# Patient Record
Sex: Male | Born: 1945 | Race: White | Hispanic: No | Marital: Single | Smoking: Never smoker
Health system: Southern US, Community
[De-identification: ages and names within clinical notes are randomized; demographics above are authoritative.]

## PROBLEM LIST (undated history)

## (undated) DIAGNOSIS — D126 Benign neoplasm of colon, unspecified: Secondary | ICD-10-CM

## (undated) DIAGNOSIS — K8020 Calculus of gallbladder without cholecystitis without obstruction: Secondary | ICD-10-CM

## (undated) DIAGNOSIS — K4020 Bilateral inguinal hernia, without obstruction or gangrene, not specified as recurrent: Secondary | ICD-10-CM

## (undated) DIAGNOSIS — K7689 Other specified diseases of liver: Secondary | ICD-10-CM

## (undated) DIAGNOSIS — I10 Essential (primary) hypertension: Secondary | ICD-10-CM

## (undated) DIAGNOSIS — K5730 Diverticulosis of large intestine without perforation or abscess without bleeding: Secondary | ICD-10-CM

## (undated) DIAGNOSIS — IMO0002 Reserved for concepts with insufficient information to code with codable children: Secondary | ICD-10-CM

## (undated) DIAGNOSIS — S22009A Unspecified fracture of unspecified thoracic vertebra, initial encounter for closed fracture: Secondary | ICD-10-CM

## (undated) DIAGNOSIS — N529 Male erectile dysfunction, unspecified: Secondary | ICD-10-CM

## (undated) DIAGNOSIS — I059 Rheumatic mitral valve disease, unspecified: Secondary | ICD-10-CM

## (undated) DIAGNOSIS — E7800 Pure hypercholesterolemia, unspecified: Secondary | ICD-10-CM

## (undated) DIAGNOSIS — S2249XA Multiple fractures of ribs, unspecified side, initial encounter for closed fracture: Secondary | ICD-10-CM

## (undated) DIAGNOSIS — R011 Cardiac murmur, unspecified: Secondary | ICD-10-CM

## (undated) DIAGNOSIS — M1990 Unspecified osteoarthritis, unspecified site: Secondary | ICD-10-CM

## (undated) DIAGNOSIS — E119 Type 2 diabetes mellitus without complications: Secondary | ICD-10-CM

## (undated) DIAGNOSIS — N200 Calculus of kidney: Secondary | ICD-10-CM

## (undated) HISTORY — DX: Unspecified fracture of unspecified thoracic vertebra, initial encounter for closed fracture: S22.009A

## (undated) HISTORY — DX: Multiple fractures of ribs, unspecified side, initial encounter for closed fracture: S22.49XA

## (undated) HISTORY — DX: Other specified diseases of liver: K76.89

## (undated) HISTORY — DX: Calculus of gallbladder without cholecystitis without obstruction: K80.20

## (undated) HISTORY — DX: Calculus of kidney: N20.0

## (undated) HISTORY — PX: BACK SURGERY: SHX140

## (undated) HISTORY — DX: Benign neoplasm of colon, unspecified: D12.6

## (undated) HISTORY — DX: Bilateral inguinal hernia, without obstruction or gangrene, not specified as recurrent: K40.20

## (undated) HISTORY — DX: Rheumatic mitral valve disease, unspecified: I05.9

## (undated) HISTORY — PX: SHOULDER ARTHROSCOPY: SHX128

## (undated) HISTORY — PX: TONSILLECTOMY: SUR1361

## (undated) HISTORY — PX: ROOT CANAL: SHX2363

## (undated) HISTORY — PX: ADENOIDECTOMY: SUR15

## (undated) HISTORY — DX: Reserved for concepts with insufficient information to code with codable children: IMO0002

## (undated) HISTORY — DX: Diverticulosis of large intestine without perforation or abscess without bleeding: K57.30

## (undated) HISTORY — DX: Pure hypercholesterolemia, unspecified: E78.00

## (undated) HISTORY — DX: Male erectile dysfunction, unspecified: N52.9

---

## 2004-06-17 ENCOUNTER — Ambulatory Visit: Payer: Self-pay | Admitting: Physician Assistant

## 2006-10-18 ENCOUNTER — Ambulatory Visit: Payer: Self-pay | Admitting: Gastroenterology

## 2009-04-01 ENCOUNTER — Ambulatory Visit: Payer: Self-pay | Admitting: Physician Assistant

## 2010-09-08 ENCOUNTER — Other Ambulatory Visit: Payer: Self-pay | Admitting: Chiropractic Medicine

## 2010-09-08 DIAGNOSIS — M545 Low back pain: Secondary | ICD-10-CM

## 2010-09-08 DIAGNOSIS — M999 Biomechanical lesion, unspecified: Secondary | ICD-10-CM

## 2010-09-08 DIAGNOSIS — M5430 Sciatica, unspecified side: Secondary | ICD-10-CM

## 2010-09-12 ENCOUNTER — Other Ambulatory Visit: Payer: Self-pay

## 2010-09-15 ENCOUNTER — Ambulatory Visit
Admission: RE | Admit: 2010-09-15 | Discharge: 2010-09-15 | Disposition: A | Discharge status: Home / Self Care | Payer: BC Managed Care – PPO | Source: Ambulatory Visit | Attending: Chiropractic Medicine | Admitting: Chiropractic Medicine

## 2010-09-15 DIAGNOSIS — M999 Biomechanical lesion, unspecified: Secondary | ICD-10-CM

## 2010-09-15 DIAGNOSIS — M545 Low back pain: Secondary | ICD-10-CM

## 2010-09-15 DIAGNOSIS — M5430 Sciatica, unspecified side: Secondary | ICD-10-CM

## 2010-10-16 ENCOUNTER — Encounter (HOSPITAL_COMMUNITY)
Admission: RE | Admit: 2010-10-16 | Discharge: 2010-10-16 | Disposition: A | Discharge status: Home / Self Care | Payer: BC Managed Care – PPO | Source: Ambulatory Visit | Attending: Neurosurgery | Admitting: Neurosurgery

## 2010-10-16 ENCOUNTER — Other Ambulatory Visit (HOSPITAL_COMMUNITY): Payer: Self-pay | Admitting: Neurosurgery

## 2010-10-16 ENCOUNTER — Ambulatory Visit (HOSPITAL_COMMUNITY)
Admission: RE | Admit: 2010-10-16 | Discharge: 2010-10-16 | Disposition: A | Discharge status: Home / Self Care | Payer: BC Managed Care – PPO | Source: Ambulatory Visit | Attending: Neurosurgery | Admitting: Neurosurgery

## 2010-10-16 DIAGNOSIS — M48061 Spinal stenosis, lumbar region without neurogenic claudication: Secondary | ICD-10-CM | POA: Insufficient documentation

## 2010-10-16 DIAGNOSIS — Z01812 Encounter for preprocedural laboratory examination: Secondary | ICD-10-CM | POA: Insufficient documentation

## 2010-10-16 DIAGNOSIS — Z01810 Encounter for preprocedural cardiovascular examination: Secondary | ICD-10-CM | POA: Insufficient documentation

## 2010-10-16 DIAGNOSIS — Z01811 Encounter for preprocedural respiratory examination: Secondary | ICD-10-CM | POA: Insufficient documentation

## 2010-10-16 LAB — CBC
MCH: 30.7 pg (ref 26.0–34.0)
MCHC: 36.1 g/dL — ABNORMAL HIGH (ref 30.0–36.0)
MCV: 85.2 fL (ref 78.0–100.0)
Platelets: 233 10*3/uL (ref 150–400)

## 2010-10-16 LAB — BASIC METABOLIC PANEL
Calcium: 10.7 mg/dL — ABNORMAL HIGH (ref 8.4–10.5)
Creatinine, Ser: 1.01 mg/dL (ref 0.50–1.35)
GFR calc non Af Amer: >60 mL/min (ref >60)
Glucose, Bld: 145 mg/dL — ABNORMAL HIGH (ref 70–99)
Sodium: 139 mEq/L (ref 135–145)

## 2010-10-16 LAB — SURGICAL PCR SCREEN
MRSA, PCR: NEGATIVE
Staphylococcus aureus: NEGATIVE

## 2010-10-22 ENCOUNTER — Ambulatory Visit (HOSPITAL_COMMUNITY): Payer: BC Managed Care – PPO

## 2010-10-22 ENCOUNTER — Ambulatory Visit (HOSPITAL_COMMUNITY)
Admission: RE | Admit: 2010-10-22 | Discharge: 2010-10-22 | Disposition: A | Discharge status: Home / Self Care | Payer: BC Managed Care – PPO | Source: Ambulatory Visit | Attending: Neurosurgery | Admitting: Neurosurgery

## 2010-10-22 DIAGNOSIS — Z01818 Encounter for other preprocedural examination: Secondary | ICD-10-CM | POA: Insufficient documentation

## 2010-10-22 DIAGNOSIS — Z01810 Encounter for preprocedural cardiovascular examination: Secondary | ICD-10-CM | POA: Insufficient documentation

## 2010-10-22 DIAGNOSIS — M5126 Other intervertebral disc displacement, lumbar region: Secondary | ICD-10-CM | POA: Insufficient documentation

## 2010-10-22 LAB — GLUCOSE, CAPILLARY: Glucose-Capillary: 137 mg/dL — ABNORMAL HIGH (ref 70–99)

## 2010-10-26 NOTE — Op Note (Signed)
Jonathan Blackburn, Jonathan Blackburn NO.:  [id number]  MEDICAL RECORD NO.:  [id number]  LOCATION:  3534                         FACILITY:  [facility]  PHYSICIAN:  Cristi Loron, M.D.DATE OF BIRTH:  [birth year]  DATE OF PROCEDURE:  10/22/2010 DATE OF DISCHARGE:  10/22/2010                              OPERATIVE REPORT   BRIEF HISTORY:  The patient is a 65 year old white male who has suffered from back and bilateral leg pain, right greater than left consistent with a lumbar radiculopathy.  He failed medical management and was worked up with a lumbar MRI which demonstrated the patient had multilevel degenerative changes with stenosis at L4-5 and a large ruptured disk behind the L4 vertebral body on the right.  I discussed various treatment options with the patient including surgery.  The patient has weighed the risks, benefits, and alternatives of surgery and decided to proceed with lumbar laminectomy with right L3-4 diskectomy.  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  POSTOPERATIVE DIAGNOSES:  L4-5 spinal stenosis, L3-4 herniated nucleus pulposus, lumbago, lumbar radiculopathy.  PROCEDURE:  L4 laminectomy with bilateral L3 laminotomies to decompress the bilateral L4 and L5 nerve roots using microdissection; right L3-4 diskectomy using microdissection.  SURGEON:  Cristi Loron, MD  ASSISTANT:  Stefani Dama, MD  ANESTHESIA:  General endotracheal.  ESTIMATED BLOOD LOSS:  100 mL.  SPECIMENS:  None.  DRAINS:  None.  COMPLICATIONS:  None.  DESCRIPTION OF PROCEDURE:  The patient was brought to the operating room by the Anesthesia team.  General endotracheal anesthesia was induced. The patient was turned to the prone position on the Wilson frame.  His lumbosacral region was then prepared with Betadine scrub and Betadine solution.  Sterile drapes were applied.  I then injected the area to  be incised with Marcaine with epinephrine solution.  I used a scalpel to make a linear midline incision over the L4-5 interspace.  I used electrocautery to perform a bilateral subperiosteal dissection exposing the spinous process and lamina of L4 and L5.  We obtained intraoperative radiograph to confirm location and inserted the [facility] retractor for exposure.  We began the decompression by incising the interspinous ligament at L3-4 and L4-5 with scalpel.  We then used Leksell rongeur to remove the L4 spinous process.  I then used high-speed drill to perform bilateral L4 laminotomies.  I then completed the L4 laminectomy with Kerrison punch and removed the L3-4 and L4-5 ligamentum flavum.  We also used high- speed drill to perform bilateral L3 laminotomies.  We then brought the operative microscope  into the field.  Under its magnification and illumination, we made sure we completed the microdissection/decompression.  I used Kerrison punch to perform foraminotomies about the bilateral L4 and L5 nerve roots.  We then used microdissection to free up the thecal sac from the epidural tissue and then gently retracted the thecal sac medially at L3-4.  I encountered as expected a large free fragment of disk herniation which had migrated over the L4 vertebral body which was mainly compressing the right L4 nerve root.  We used the micro pituitary forceps and removed disk herniation in multiple  fragments.  We then inspected intravertebral disk L3-4 and L4-5.  I did not see any large holes at the annulus or any impending herniations.  We therefore did not enter into the intervertebral disk space.  I then palpated along the ventral surface of the thecal sac and along the exit route of L4 and L5 nerve roots bilaterally.  We noted that they were well decompressed.  We obtained hemostasis using bipolar electrocautery.  We then irrigated the wound out with bacitracin solution, removed the  retractor, and reapproximated the patient's thoracolumbar fascia with interrupted #1 Vicryl suture, subcutaneous tissue with interrupted 2-0 Vicryl suture, and the skin with Steri-Strips and benzoin.  The wound was then coated with bacitracin ointment.  Sterile dressings were applied.  The drapes were removed.  The patient was subsequently returned to supine position where he was extubated by the Anesthesia team and transported to the Post Anesthesia Care Unit in stable condition.  All sponge, instrument, and needle counts were correct at the end of this case.     Cristi Loron, M.D.     JDJ/MEDQ  D:  10/22/2010  T:  10/23/2010  Job:  [id number]  Electronically Signed by Tressie Stalker M.D. on 10/26/2010 09:05:24 AM

## 2011-09-11 ENCOUNTER — Ambulatory Visit: Payer: Self-pay | Admitting: Physician Assistant

## 2012-05-26 ENCOUNTER — Ambulatory Visit: Payer: Self-pay | Admitting: Orthopedic Surgery

## 2012-10-04 ENCOUNTER — Ambulatory Visit
Admission: RE | Admit: 2012-10-04 | Discharge: 2012-10-04 | Disposition: A | Discharge status: Home / Self Care | Payer: Medicare Other | Source: Ambulatory Visit | Attending: Neurosurgery | Admitting: Neurosurgery

## 2012-10-04 ENCOUNTER — Other Ambulatory Visit: Payer: Self-pay | Admitting: Neurosurgery

## 2012-10-07 ENCOUNTER — Other Ambulatory Visit: Payer: Self-pay | Admitting: Neurosurgery

## 2012-11-01 ENCOUNTER — Encounter (HOSPITAL_COMMUNITY): Payer: Self-pay | Admitting: Pharmacy Technician

## 2012-11-01 NOTE — Pre-Procedure Instructions (Signed)
Jonathan Blackburn  11/01/2012   Your procedure is scheduled on: October 8  Report to [facility] Entr[facility] "A" [address] at [employer] AM.  Call this number if you have problems the morning of surgery: [phone]   Remember:   Do not eat food or drink liquids after midnight.   Take these medicines the morning of surgery with A SIP OF WATER: Amlodipine,     STOP Vitamin D and OsCal October 1  Do not take Aspirin, Aleve, Naproxen, Advil, Ibuprofen, Vitamin, Herbs, or Supplements starting October 1  Do not wear jewelry, make-up or nail polish.  Do not wear lotions, powders, or perfumes. You may wear deodorant.  Do not shave 48 hours prior to surgery. Men may shave face and neck.  Do not bring valuables to the hospital.  [facility] is not responsible                   for any belongings or valuables.  Contacts, dentures or bridgework may not be worn into surgery.  Leave suitcase in the car. After surgery it may be brought to your room.    Special Instructions: Shower using CHG 2 nights before surgery and the night before surgery.  If you shower the day of surgery use CHG.  Use special wash - you have one bottle of CHG for all showers.  You should use approximately 1/3 of the bottle for each shower.   Please read over the following fact sheets that you were given: Pain Booklet, Coughing and Deep Breathing, Blood Transfusion Information and Surgical Site Infection Prevention

## 2012-11-02 ENCOUNTER — Encounter (HOSPITAL_COMMUNITY)
Admission: RE | Admit: 2012-11-02 | Discharge: 2012-11-02 | Disposition: A | Discharge status: Home / Self Care | Payer: Medicare Other | Source: Ambulatory Visit | Attending: Neurosurgery | Admitting: Neurosurgery

## 2012-11-02 ENCOUNTER — Encounter (HOSPITAL_COMMUNITY): Payer: Self-pay

## 2012-11-02 DIAGNOSIS — Z01812 Encounter for preprocedural laboratory examination: Secondary | ICD-10-CM | POA: Insufficient documentation

## 2012-11-02 DIAGNOSIS — Z01810 Encounter for preprocedural cardiovascular examination: Secondary | ICD-10-CM | POA: Insufficient documentation

## 2012-11-02 DIAGNOSIS — Z01818 Encounter for other preprocedural examination: Secondary | ICD-10-CM | POA: Insufficient documentation

## 2012-11-02 HISTORY — DX: Type 2 diabetes mellitus without complications: E11.9

## 2012-11-02 HISTORY — DX: Essential (primary) hypertension: I10

## 2012-11-02 HISTORY — DX: Unspecified osteoarthritis, unspecified site: M19.90

## 2012-11-02 HISTORY — DX: Cardiac murmur, unspecified: R01.1

## 2012-11-02 LAB — BASIC METABOLIC PANEL
Calcium: 9.8 mg/dL (ref 8.4–10.5)
Chloride: 104 mEq/L (ref 96–112)
GFR calc Af Amer: >90 mL/min (ref >90)
GFR calc non Af Amer: 84 mL/min — ABNORMAL LOW (ref >90)
Glucose, Bld: 239 mg/dL — ABNORMAL HIGH (ref 70–99)
Sodium: 139 mEq/L (ref 135–145)

## 2012-11-02 LAB — CBC
Hemoglobin: 16.4 g/dL (ref 13.0–17.0)
MCH: 30.2 pg (ref 26.0–34.0)
Platelets: 235 10*3/uL (ref 150–400)
RBC: 5.43 MIL/uL (ref 4.22–5.81)

## 2012-11-02 LAB — TYPE AND SCREEN
ABO/RH(D): A POS
Antibody Screen: NEGATIVE

## 2012-11-02 LAB — SURGICAL PCR SCREEN: MRSA, PCR: NEGATIVE

## 2012-11-02 LAB — ABO/RH: ABO/RH(D): A POS

## 2012-11-02 MED ORDER — CEFAZOLIN SODIUM-DEXTROSE 2-3 GM-% IV SOLR: 2.0000 g | INTRAVENOUS | Status: DC

## 2012-11-02 NOTE — Progress Notes (Signed)
Dr Kirtland Bouchard at [facility] called for echo,OV notes

## 2012-11-16 ENCOUNTER — Encounter (HOSPITAL_COMMUNITY): Payer: Self-pay | Admitting: *Deleted

## 2012-11-16 ENCOUNTER — Encounter (HOSPITAL_COMMUNITY): Payer: Medicare Other | Admitting: Anesthesiology

## 2012-11-16 ENCOUNTER — Encounter (HOSPITAL_COMMUNITY)
Admission: RE | Disposition: A | Discharge status: Home Health | Payer: Medicare Other | Source: Ambulatory Visit | Attending: Neurosurgery

## 2012-11-16 ENCOUNTER — Inpatient Hospital Stay (HOSPITAL_COMMUNITY)
Admission: RE | Admit: 2012-11-16 | Discharge: 2012-11-18 | DRG: 460 | Disposition: A | Discharge status: Home Health | Payer: Medicare Other | Source: Ambulatory Visit | Attending: Neurosurgery | Admitting: Neurosurgery

## 2012-11-16 ENCOUNTER — Inpatient Hospital Stay (HOSPITAL_COMMUNITY): Payer: Medicare Other | Admitting: Anesthesiology

## 2012-11-16 ENCOUNTER — Inpatient Hospital Stay (HOSPITAL_COMMUNITY): Payer: Medicare Other

## 2012-11-16 DIAGNOSIS — S22009K Unspecified fracture of unspecified thoracic vertebra, subsequent encounter for fracture with nonunion: Secondary | ICD-10-CM

## 2012-11-16 HISTORY — PX: POSTERIOR LUMBAR FUSION 4 LEVEL: SHX6037

## 2012-11-16 LAB — GLUCOSE, CAPILLARY: Glucose-Capillary: 185 mg/dL — ABNORMAL HIGH (ref 70–99)

## 2012-11-16 SURGERY — POSTERIOR LUMBAR FUSION 4 LEVEL
Anesthesia: General | Site: Back | Wound class: Clean

## 2012-11-16 MED ORDER — MIDAZOLAM HCL 5 MG/5ML IJ SOLN
INTRAMUSCULAR | Status: DC | PRN
  Administered 2012-11-16: 2 mg via INTRAVENOUS

## 2012-11-16 MED ORDER — OXYCODONE-ACETAMINOPHEN 5-325 MG PO TABS
1.0000 | ORAL_TABLET | ORAL | Status: DC | PRN
  Administered 2012-11-17 (×3): 2 via ORAL
  Administered 2012-11-18: 1 via ORAL
  Administered 2012-11-18: 2 via ORAL
  Filled 2012-11-16 (×3): qty 2
  Filled 2012-11-16: qty 1
  Filled 2012-11-16: qty 2

## 2012-11-16 MED ORDER — ROCURONIUM BROMIDE 100 MG/10ML IV SOLN
INTRAVENOUS | Status: DC | PRN
  Administered 2012-11-16: 10 mg via INTRAVENOUS
  Administered 2012-11-16: 50 mg via INTRAVENOUS
  Administered 2012-11-16: 10 mg via INTRAVENOUS
  Administered 2012-11-16: 40 mg via INTRAVENOUS

## 2012-11-16 MED ORDER — HYDROMORPHONE HCL PF 1 MG/ML IJ SOLN
0.2500 mg | INTRAMUSCULAR | Status: DC | PRN
  Administered 2012-11-16 (×2): 0.5 mg via INTRAVENOUS

## 2012-11-16 MED ORDER — LIDOCAINE HCL (CARDIAC) 20 MG/ML IV SOLN
INTRAVENOUS | Status: DC | PRN
  Administered 2012-11-16: 100 mg via INTRAVENOUS

## 2012-11-16 MED ORDER — CEFAZOLIN SODIUM-DEXTROSE 2-3 GM-% IV SOLR
INTRAVENOUS | Status: AC
  Administered 2012-11-16: 2 g via INTRAVENOUS
  Filled 2012-11-16: qty 50

## 2012-11-16 MED ORDER — PROPOFOL 10 MG/ML IV BOLUS
INTRAVENOUS | Status: DC | PRN
  Administered 2012-11-16: 120 mg via INTRAVENOUS

## 2012-11-16 MED ORDER — BUPIVACAINE-EPINEPHRINE PF 0.5-1:200000 % IJ SOLN
INTRAMUSCULAR | Status: DC | PRN
  Administered 2012-11-16: 17 mL

## 2012-11-16 MED ORDER — LISINOPRIL 20 MG PO TABS
20.0000 mg | ORAL_TABLET | Freq: Every day | ORAL | Status: DC
  Administered 2012-11-16 – 2012-11-17 (×2): 20 mg via ORAL
  Filled 2012-11-16 (×3): qty 1

## 2012-11-16 MED ORDER — LACTATED RINGERS IV SOLN
INTRAVENOUS | Status: DC | PRN
  Administered 2012-11-16 (×2): via INTRAVENOUS

## 2012-11-16 MED ORDER — CEFAZOLIN SODIUM-DEXTROSE 2-3 GM-% IV SOLR
2.0000 g | Freq: Three times a day (TID) | INTRAVENOUS | Status: AC
  Administered 2012-11-16 (×2): 2 g via INTRAVENOUS
  Filled 2012-11-16 (×2): qty 50

## 2012-11-16 MED ORDER — ACETAMINOPHEN 650 MG RE SUPP: 650.0000 mg | RECTAL | Status: DC | PRN

## 2012-11-16 MED ORDER — THROMBIN 20000 UNITS EX SOLR
CUTANEOUS | Status: DC | PRN
  Administered 2012-11-16: 10:00:00 via TOPICAL

## 2012-11-16 MED ORDER — ONDANSETRON HCL 4 MG/2ML IJ SOLN
INTRAMUSCULAR | Status: DC | PRN
  Administered 2012-11-16: 4 mg via INTRAMUSCULAR

## 2012-11-16 MED ORDER — DIPHENHYDRAMINE HCL 12.5 MG/5ML PO ELIX: 12.5000 mg | ORAL_SOLUTION | Freq: Four times a day (QID) | ORAL | Status: DC | PRN

## 2012-11-16 MED ORDER — DIAZEPAM 5 MG PO TABS
ORAL_TABLET | ORAL | Status: AC
  Administered 2012-11-16: 5 mg
  Filled 2012-11-16: qty 1

## 2012-11-16 MED ORDER — ONDANSETRON HCL 4 MG/2ML IJ SOLN: 4.0000 mg | Freq: Four times a day (QID) | INTRAMUSCULAR | Status: DC | PRN

## 2012-11-16 MED ORDER — PHENYLEPHRINE HCL 10 MG/ML IJ SOLN
INTRAMUSCULAR | Status: DC | PRN
  Administered 2012-11-16 (×2): 80 ug via INTRAVENOUS
  Administered 2012-11-16: 120 ug via INTRAVENOUS
  Administered 2012-11-16 (×2): 80 ug via INTRAVENOUS
  Administered 2012-11-16: 120 ug via INTRAVENOUS
  Administered 2012-11-16 (×2): 80 ug via INTRAVENOUS
  Administered 2012-11-16: 40 ug via INTRAVENOUS

## 2012-11-16 MED ORDER — LACTATED RINGERS IV SOLN
INTRAVENOUS | Status: DC
  Administered 2012-11-16 – 2012-11-17 (×2): via INTRAVENOUS

## 2012-11-16 MED ORDER — ACETAMINOPHEN 325 MG PO TABS
650.0000 mg | ORAL_TABLET | ORAL | Status: DC | PRN
  Administered 2012-11-16 – 2012-11-17 (×2): 650 mg via ORAL
  Filled 2012-11-16 (×2): qty 2

## 2012-11-16 MED ORDER — ALUM & MAG HYDROXIDE-SIMETH 200-200-20 MG/5ML PO SUSP: 30.0000 mL | Freq: Four times a day (QID) | ORAL | Status: DC | PRN

## 2012-11-16 MED ORDER — OXYCODONE HCL 5 MG PO TABS
ORAL_TABLET | ORAL | Status: AC
  Filled 2012-11-16: qty 1

## 2012-11-16 MED ORDER — OXYCODONE HCL 5 MG PO TABS
5.0000 mg | ORAL_TABLET | Freq: Once | ORAL | Status: AC | PRN
  Administered 2012-11-16: 5 mg via ORAL

## 2012-11-16 MED ORDER — PHENOL 1.4 % MT LIQD: 1.0000 | OROMUCOSAL | Status: DC | PRN

## 2012-11-16 MED ORDER — NALOXONE HCL 0.4 MG/ML IJ SOLN: 0.4000 mg | INTRAMUSCULAR | Status: DC | PRN

## 2012-11-16 MED ORDER — ALBUMIN HUMAN 5 % IV SOLN
INTRAVENOUS | Status: DC | PRN
  Administered 2012-11-16: 11:00:00 via INTRAVENOUS

## 2012-11-16 MED ORDER — AMLODIPINE BESYLATE 5 MG PO TABS
5.0000 mg | ORAL_TABLET | Freq: Every day | ORAL | Status: DC
  Administered 2012-11-16 – 2012-11-17 (×2): 5 mg via ORAL
  Filled 2012-11-16 (×3): qty 1

## 2012-11-16 MED ORDER — SODIUM CHLORIDE 0.9 % IJ SOLN: 9.0000 mL | INTRAMUSCULAR | Status: DC | PRN

## 2012-11-16 MED ORDER — MEPERIDINE HCL 25 MG/ML IJ SOLN: 6.2500 mg | INTRAMUSCULAR | Status: DC | PRN

## 2012-11-16 MED ORDER — DOCUSATE SODIUM 100 MG PO CAPS
100.0000 mg | ORAL_CAPSULE | Freq: Two times a day (BID) | ORAL | Status: DC
  Administered 2012-11-16 – 2012-11-18 (×5): 100 mg via ORAL
  Filled 2012-11-16 (×5): qty 1

## 2012-11-16 MED ORDER — DIPHENHYDRAMINE HCL 50 MG/ML IJ SOLN: 12.5000 mg | Freq: Four times a day (QID) | INTRAMUSCULAR | Status: DC | PRN

## 2012-11-16 MED ORDER — BUPIVACAINE LIPOSOME 1.3 % IJ SUSP
20.0000 mL | Freq: Once | INTRAMUSCULAR | Status: DC
  Filled 2012-11-16: qty 20

## 2012-11-16 MED ORDER — HYDROCODONE-ACETAMINOPHEN 5-325 MG PO TABS: 1.0000 | ORAL_TABLET | ORAL | Status: DC | PRN

## 2012-11-16 MED ORDER — SODIUM CHLORIDE 0.9 % IR SOLN
Status: DC | PRN
  Administered 2012-11-16: 10:00:00

## 2012-11-16 MED ORDER — GLYCOPYRROLATE 0.2 MG/ML IJ SOLN
INTRAMUSCULAR | Status: DC | PRN
  Administered 2012-11-16: .8 mg via INTRAVENOUS

## 2012-11-16 MED ORDER — MORPHINE SULFATE (PF) 1 MG/ML IV SOLN
INTRAVENOUS | Status: DC
  Administered 2012-11-16: 3 mg via INTRAVENOUS
  Administered 2012-11-16: 2.7 mg via INTRAVENOUS
  Administered 2012-11-16: 13:00:00 via INTRAVENOUS
  Administered 2012-11-16: 13.79 mg via INTRAVENOUS
  Administered 2012-11-17: 3 mg via INTRAVENOUS
  Administered 2012-11-17: 13.5 mg via INTRAVENOUS
  Filled 2012-11-16: qty 25

## 2012-11-16 MED ORDER — NEOSTIGMINE METHYLSULFATE 1 MG/ML IJ SOLN
INTRAMUSCULAR | Status: DC | PRN
  Administered 2012-11-16: 4 mg via INTRAVENOUS

## 2012-11-16 MED ORDER — FENTANYL CITRATE 0.05 MG/ML IJ SOLN
INTRAMUSCULAR | Status: DC | PRN
  Administered 2012-11-16: 50 ug via INTRAVENOUS
  Administered 2012-11-16: 150 ug via INTRAVENOUS
  Administered 2012-11-16: 50 ug via INTRAVENOUS

## 2012-11-16 MED ORDER — OXYCODONE HCL 5 MG/5ML PO SOLN: 5.0000 mg | Freq: Once | ORAL | Status: AC | PRN

## 2012-11-16 MED ORDER — MORPHINE SULFATE (PF) 1 MG/ML IV SOLN
INTRAVENOUS | Status: AC
  Administered 2012-11-16: 6 mg
  Filled 2012-11-16: qty 25

## 2012-11-16 MED ORDER — ONDANSETRON HCL 4 MG/2ML IJ SOLN: 4.0000 mg | INTRAMUSCULAR | Status: DC | PRN

## 2012-11-16 MED ORDER — METFORMIN HCL ER 500 MG PO TB24
500.0000 mg | ORAL_TABLET | Freq: Every day | ORAL | Status: DC
  Filled 2012-11-16 (×3): qty 1

## 2012-11-16 MED ORDER — DIAZEPAM 5 MG PO TABS
5.0000 mg | ORAL_TABLET | Freq: Four times a day (QID) | ORAL | Status: DC | PRN
  Administered 2012-11-16 – 2012-11-18 (×4): 5 mg via ORAL
  Filled 2012-11-16 (×4): qty 1

## 2012-11-16 MED ORDER — FENOFIBRATE 160 MG PO TABS
160.0000 mg | ORAL_TABLET | Freq: Every day | ORAL | Status: DC
  Administered 2012-11-16 – 2012-11-18 (×3): 160 mg via ORAL
  Filled 2012-11-16 (×3): qty 1

## 2012-11-16 MED ORDER — HYDROMORPHONE HCL PF 1 MG/ML IJ SOLN
INTRAMUSCULAR | Status: AC
  Filled 2012-11-16: qty 1

## 2012-11-16 MED ORDER — ONDANSETRON HCL 4 MG/2ML IJ SOLN: 4.0000 mg | Freq: Once | INTRAMUSCULAR | Status: DC | PRN

## 2012-11-16 MED ORDER — 0.9 % SODIUM CHLORIDE (POUR BTL) OPTIME
TOPICAL | Status: DC | PRN
  Administered 2012-11-16: 1000 mL

## 2012-11-16 MED ORDER — INSULIN ASPART 100 UNIT/ML ~~LOC~~ SOLN
0.0000 [IU] | SUBCUTANEOUS | Status: DC
  Administered 2012-11-16: 3 [IU] via SUBCUTANEOUS
  Administered 2012-11-17: 5 [IU] via SUBCUTANEOUS
  Administered 2012-11-17: 3 [IU] via SUBCUTANEOUS
  Administered 2012-11-17: 5 [IU] via SUBCUTANEOUS
  Administered 2012-11-17: 3 [IU] via SUBCUTANEOUS
  Administered 2012-11-17: 5 [IU] via SUBCUTANEOUS
  Administered 2012-11-17 – 2012-11-18 (×4): 3 [IU] via SUBCUTANEOUS

## 2012-11-16 MED ORDER — BACITRACIN ZINC 500 UNIT/GM EX OINT
TOPICAL_OINTMENT | CUTANEOUS | Status: DC | PRN
  Administered 2012-11-16: 1 via TOPICAL

## 2012-11-16 MED ORDER — BUPIVACAINE LIPOSOME 1.3 % IJ SUSP
INTRAMUSCULAR | Status: DC | PRN
  Administered 2012-11-16: 20 mL

## 2012-11-16 MED ORDER — MENTHOL 3 MG MT LOZG
1.0000 | LOZENGE | OROMUCOSAL | Status: DC | PRN
  Administered 2012-11-18: 3 mg via ORAL
  Filled 2012-11-16: qty 9

## 2012-11-16 SURGICAL SUPPLY — 69 items
BAG DECANTER FOR FLEXI CONT (MISCELLANEOUS) ×2 IMPLANT
BENZOIN TINCTURE PRP APPL 2/3 (GAUZE/BANDAGES/DRESSINGS) ×2 IMPLANT
BIT DRILL NEURO 2X3.1 SFT TUCH (MISCELLANEOUS) ×1 IMPLANT
BLADE SURG ROTATE 9660 (MISCELLANEOUS) ×2 IMPLANT
BRUSH SCRUB EZ PLAIN DRY (MISCELLANEOUS) ×2 IMPLANT
BUR ACORN 6.0 (BURR) ×2 IMPLANT
BUR MATCHSTICK NEURO 3.0 LAGG (BURR) ×2 IMPLANT
CANISTER SUCTION 2500CC (MISCELLANEOUS) ×2 IMPLANT
CAP REVERE LOCKING (Cap) ×16 IMPLANT
CLOTH BEACON ORANGE TIMEOUT ST (SAFETY) IMPLANT
CONT SPEC 4OZ CLIKSEAL STRL BL (MISCELLANEOUS) ×2 IMPLANT
COVER BACK TABLE 24X17X13 BIG (DRAPES) IMPLANT
COVER TABLE BACK 60X90 (DRAPES) ×2 IMPLANT
DRAPE C-ARM 42X72 X-RAY (DRAPES) ×4 IMPLANT
DRAPE C-ARMOR (DRAPES) ×2 IMPLANT
DRAPE LAPAROTOMY 100X72X124 (DRAPES) ×2 IMPLANT
DRAPE POUCH INSTRU U-SHP 10X18 (DRAPES) ×2 IMPLANT
DRAPE PROXIMA HALF (DRAPES) IMPLANT
DRAPE SURG 17X23 STRL (DRAPES) ×8 IMPLANT
DRILL NEURO 2X3.1 SOFT TOUCH (MISCELLANEOUS) ×2
ELECT BLADE 4.0 EZ CLEAN MEGAD (MISCELLANEOUS) ×2
ELECT CAUTERY BLADE 6.4 (BLADE) ×2 IMPLANT
ELECT REM PT RETURN 9FT ADLT (ELECTROSURGICAL) ×2
ELECTRODE BLDE 4.0 EZ CLN MEGD (MISCELLANEOUS) ×1 IMPLANT
ELECTRODE REM PT RTRN 9FT ADLT (ELECTROSURGICAL) ×1 IMPLANT
GAUZE SPONGE 4X4 16PLY XRAY LF (GAUZE/BANDAGES/DRESSINGS) ×2 IMPLANT
GLOVE BIO SURGEON STRL SZ8.5 (GLOVE) ×2 IMPLANT
GLOVE BIOGEL M 8.0 STRL (GLOVE) ×2 IMPLANT
GLOVE BIOGEL PI IND STRL 7.5 (GLOVE) ×1 IMPLANT
GLOVE BIOGEL PI INDICATOR 7.5 (GLOVE) ×1
GLOVE EXAM NITRILE LRG STRL (GLOVE) ×2 IMPLANT
GLOVE EXAM NITRILE MD LF STRL (GLOVE) IMPLANT
GLOVE EXAM NITRILE XL STR (GLOVE) IMPLANT
GLOVE EXAM NITRILE XS STR PU (GLOVE) IMPLANT
GLOVE SS BIOGEL STRL SZ 8 (GLOVE) ×1 IMPLANT
GLOVE SUPERSENSE BIOGEL SZ 8 (GLOVE) ×1
GLOVE SURG SS PI 7.0 STRL IVOR (GLOVE) ×6 IMPLANT
GOWN BRE IMP SLV AUR LG STRL (GOWN DISPOSABLE) ×2 IMPLANT
GOWN BRE IMP SLV AUR XL STRL (GOWN DISPOSABLE) ×4 IMPLANT
GOWN STRL REIN 2XL LVL4 (GOWN DISPOSABLE) IMPLANT
KIT BASIN OR (CUSTOM PROCEDURE TRAY) ×2 IMPLANT
KIT INFUSE SMALL (Orthopedic Implant) ×2 IMPLANT
KIT ROOM TURNOVER OR (KITS) ×2 IMPLANT
MARKER SKIN DUAL TIP RULER LAB (MISCELLANEOUS) ×2 IMPLANT
NEEDLE HYPO 21X1.5 SAFETY (NEEDLE) ×2 IMPLANT
NEEDLE HYPO 22GX1.5 SAFETY (NEEDLE) ×2 IMPLANT
NS IRRIG 1000ML POUR BTL (IV SOLUTION) ×2 IMPLANT
PACK LAMINECTOMY NEURO (CUSTOM PROCEDURE TRAY) ×2 IMPLANT
PAD ARMBOARD 7.5X6 YLW CONV (MISCELLANEOUS) ×8 IMPLANT
PATTIES SURGICAL .5 X1 (DISPOSABLE) IMPLANT
PUTTY ABX ACTIFUSE 20ML (Putty) ×2 IMPLANT
ROD REVERE 6.35 CURVED 150MM (Rod) ×4 IMPLANT
SCREW 7.5X50MM (Screw) ×8 IMPLANT
SCREW REVERE 6.35 6.5MMX45 (Screw) ×8 IMPLANT
SPONGE GAUZE 4X4 12PLY (GAUZE/BANDAGES/DRESSINGS) ×2 IMPLANT
SPONGE LAP 4X18 X RAY DECT (DISPOSABLE) IMPLANT
SPONGE NEURO XRAY DETECT 1X3 (DISPOSABLE) IMPLANT
SPONGE SURGIFOAM ABS GEL 100 (HEMOSTASIS) ×2 IMPLANT
STRIP CLOSURE SKIN 1/2X4 (GAUZE/BANDAGES/DRESSINGS) ×2 IMPLANT
SUT VIC AB 1 CT1 18XBRD ANBCTR (SUTURE) ×2 IMPLANT
SUT VIC AB 1 CT1 8-18 (SUTURE) ×2
SUT VIC AB 2-0 CP2 18 (SUTURE) ×4 IMPLANT
SYR 20CC LL (SYRINGE) ×2 IMPLANT
SYR 20ML ECCENTRIC (SYRINGE) ×2 IMPLANT
TAPE CLOTH SURG 4X10 WHT LF (GAUZE/BANDAGES/DRESSINGS) ×2 IMPLANT
TOWEL OR 17X24 6PK STRL BLUE (TOWEL DISPOSABLE) ×2 IMPLANT
TOWEL OR 17X26 10 PK STRL BLUE (TOWEL DISPOSABLE) ×2 IMPLANT
TRAY FOLEY CATH 14FRSI W/METER (CATHETERS) IMPLANT
WATER STERILE IRR 1000ML POUR (IV SOLUTION) ×2 IMPLANT

## 2012-11-16 NOTE — Anesthesia Postprocedure Evaluation (Signed)
Anesthesia Post Note  Patient: Jonathan Blackburn  Procedure(s) Performed: Procedure(s) (LRB): Thoracic seven to thoracic eleven posterior lateral thoracic arthrodesis with instrumentation and bone graft  (N/A)  Anesthesia type: general  Patient location: PACU  Post pain: Pain level controlled  Post assessment: Patient's Cardiovascular Status Stable  Last Vitals:  Filed Vitals:   11/16/12 1350  BP: 145/78  Pulse: 71  Temp: 36.4 C  Resp: 16    Post vital signs: Reviewed and stable  Level of consciousness: sedated  Complications: No apparent anesthesia complications

## 2012-11-16 NOTE — Anesthesia Preprocedure Evaluation (Addendum)
Anesthesia Evaluation  Patient identified by MRN, date of birth, ID band Patient awake    Reviewed: Allergy & Precautions, H&P , NPO status , Patient's Chart, lab work & pertinent test results  Airway Mallampati: II TM Distance: >3 FB Neck ROM: Full    Dental  (+) Teeth Intact   Pulmonary          Cardiovascular hypertension, Pt. on medications + Valvular Problems/Murmurs     Neuro/Psych    GI/Hepatic   Endo/Other  diabetes, Well Controlled, Type 2  Renal/GU      Musculoskeletal   Abdominal   Peds  Hematology   Anesthesia Other Findings   Reproductive/Obstetrics                         Anesthesia Physical Anesthesia Plan  ASA: III  Anesthesia Plan: General   Post-op Pain Management:    Induction: Intravenous  Airway Management Planned: Oral ETT  Additional Equipment:   Intra-op Plan:   Post-operative Plan: Extubation in OR  Informed Consent: I have reviewed the patients History and Physical, chart, labs and discussed the procedure including the risks, benefits and alternatives for the proposed anesthesia with the patient or authorized representative who has indicated his/her understanding and acceptance.   Dental advisory given  Plan Discussed with: CRNA and Surgeon  Anesthesia Plan Comments:        Anesthesia Quick Evaluation

## 2012-11-16 NOTE — H&P (Signed)
Subjective: The patient is a 67 year old white male who I previously performed lumbar surgery on. He was injured by a falling tre  are all her this year. Suffering a T9 chance fracture. He has been managed in an orthosis. Followup x-rays have demonstrated that it the fracture has not healed. I discussed the various treatment option with the patient and his wife including surgery. He has weighed the risks, benefits, and alternatives surgery and decided proceed with approximately T7-T11 instrumentation and fusion.  Past Medical History  Diagnosis Date  . Diabetes mellitus without complication   . Hypertension   . Heart murmur   . Arthritis     Past Surgical History  Procedure Laterality Date  . Back surgery    . Shoulder arthroscopy      rotator cuff    No Known Allergies  History  Substance Use Topics  . Smoking status: Never Smoker   . Smokeless tobacco: Not on file  . Alcohol Use: Yes     Comment: weekly    History reviewed. No pertinent family history. Prior to Admission medications   Medication Sig Start Date End Date Taking? Authorizing Provider  amLODipine (NORVASC) 5 MG tablet Take 5 mg by mouth daily.   Yes Historical Provider, MD  aspirin EC 81 MG tablet Take 81 mg by mouth daily.   Yes Historical Provider, MD  calcium carbonate (OS-CAL - DOSED IN MG OF ELEMENTAL CALCIUM) 1250 MG tablet Take 1 tablet by mouth daily with breakfast.   Yes Historical Provider, MD  Cholecalciferol (VITAMIN D3) 5000 UNITS CAPS Take 5,000 Units by mouth daily.   Yes Historical Provider, MD  fenofibrate micronized (LOFIBRA) 134 MG capsule Take 134 mg by mouth daily before breakfast.   Yes Historical Provider, MD  lisinopril (PRINIVIL,ZESTRIL) 20 MG tablet Take 20 mg by mouth daily.   Yes Historical Provider, MD  metFORMIN (GLUCOPHAGE-XR) 500 MG 24 hr tablet Take 500 mg by mouth daily with breakfast.   Yes Historical Provider, MD     Review of Systems  Positive ROS:  As above  All other  systems have been reviewed and were otherwise negative with the exception of those mentioned in the HPI and as above.  Objective: Vital signs in last 24 hours: Temp:  [97.9 F (36.6 C)] 97.9 F (36.6 C) (10/08 1610) Pulse Rate:  [62] 62 (10/08 0632) Resp:  [16] 16 (10/08 0632) BP: (147)/(84) 147/84 mmHg (10/08 0632) SpO2:  [98 %] 98 % (10/08 9604)  General Appearance: Alert, cooperative, no distress, appears stated age Head: Normocephalic, without obvious abnormality, atraumatic Eyes: PERRL, conjunctiva/corneas clear, EOM's intact, fundi benign, both eyes      Ears: Normal TM's and external ear canals, both ears Throat: Lips, mucosa, and tongue normal; teeth and gums normal Neck: Supple, symmetrical, trachea midline, no adenopathy; thyroid: No enlargement/tenderness/nodules; no carotid bruit or JVD Back: Symmetric, no curvature, ROM normal, no CVA tenderness Lungs: Clear to auscultation bilaterally, respirations unlabored Heart: Regular rate and rhythm, S1 and S2 normal, no murmur, rub or gallop Abdomen: Soft, non-tender, bowel sounds active all four quadrants, no masses, no organomegaly Extremities: Extremities normal, atraumatic, no cyanosis or edema Pulses: 2+ and symmetric all extremities Skin: Skin color, texture, turgor normal, no rashes or lesions  NEUROLOGIC:   Mental status: alert and oriented, no aphasia, good attention span, Fund of knowledge/ memory ok Motor Exam - grossly normal Sensory Exam - grossly normal Reflexes:  Coordination - grossly normal Gait - grossly normal Balance - grossly  normal Cranial Nerves: I: smell Not tested  II: visual acuity  OS:  Normal    OD:  Normal  II: visual fields Full to confrontation  II: pupils Equal, round, reactive to light  III,VII: ptosis None  III,IV,VI: extraocular muscles  Full ROM  V: mastication Normal  V: facial light touch sensation  Normal  V,VII: corneal reflex  Present  VII: facial muscle function - upper   Normal  VII: facial muscle function - lower Normal  VIII: hearing Not tested  IX: soft palate elevation  Normal  IX,X: gag reflex Present  XI: trapezius strength  5/5  XI: sternocleidomastoid strength 5/5  XI: neck flexion strength  5/5  XII: tongue strength  Normal    Data Review Lab Results  Component Value Date   WBC 7.1 11/02/2012   HGB 16.4 11/02/2012   HCT 47.2 11/02/2012   MCV 86.9 11/02/2012   PLT 235 11/02/2012   Lab Results  Component Value Date   NA 139 11/02/2012   K 3.7 11/02/2012   CL 104 11/02/2012   CO2 21 11/02/2012   BUN 23 11/02/2012   CREATININE 0.96 11/02/2012   GLUCOSE 239* 11/02/2012   No results found for this basename: INR, PROTIME    Assessment/Plan:  T9 fracture: I discussed situation with the patient. I reviewed his imaging studies with them and pointed out the abnormalities. We have discussed the various treatment options including surgery. I described the surgical treatment option of approximately T7-T11 posterior instrumentation and fusion with pedicle screws and rods. I have described the surgery to him. I've shown him surgical models. We have discussed the risks, benefits, alternatives, and likelihood of achieving our goals with surgery. I've answered all the patient, and his wife's, questions. He has decided proceed with surgery.   Hayzlee Mcsorley D 11/16/2012 8:22 AM

## 2012-11-16 NOTE — Progress Notes (Signed)
UR COMPLETED  

## 2012-11-16 NOTE — Transfer of Care (Signed)
Immediate Anesthesia Transfer of Care Note  Patient: Web designer  Procedure(s) Performed: Procedure(s) with comments: Thoracic seven to thoracic eleven posterior lateral thoracic arthrodesis with instrumentation and bone graft  (N/A) - Thoracic seven to thoracic eleven posterior lateral thoracic arthrodesis with instrumentation and bone graft  Patient Location: PACU  Anesthesia Type:General  Level of Consciousness: awake and alert   Airway & Oxygen Therapy: Patient Spontanous Breathing and Patient connected to nasal cannula oxygen  Post-op Assessment: Report given to PACU RN and Post -op Vital signs reviewed and stable  Post vital signs: Reviewed and stable  Complications: No apparent anesthesia complications

## 2012-11-16 NOTE — Preoperative (Signed)
Beta Blockers   Reason not to administer Beta Blockers:Not Applicable 

## 2012-11-16 NOTE — Op Note (Signed)
Brief history: The patient is a 67 year old white male who suffered a T9 chance fracture on 04/30/2012 when he was struck by a tree limb. He was initially treated with an orthosis. Despite months of conservative care he did not heal this fracture. I discussed the various treatment option with the patient and his wife including surgery. The patient has weighed the risks, benefits, and alternatives surgery and decided proceed with a thoracic instrumentation and fusion.  Preop diagnosis: T9 chance fracture, thoracic pain  Postop diagnosis: The same  Procedure: T7-T11 posterior arthrodesis with local morselized autograft bone, Actifuse bone graft extender, and bone morphogenic protein; T7-T11 posterior segmental instrumentation with globus titanium pedicle screws and rods.  Surgeon: Dr. Delma Officer  Assistant: Dr. Hilda Lias  Anesthesia: Gen. endotracheal  Estimated blood loss: 200 cc  Specimens: None  Drains: None  Complications: None  Description of procedure: The patient was brought to the operating room by the anesthesia team. General endotracheal anesthesia was induced. He was carefully turned to the prone position on the Wilson frame. His thoracic region was then shaved with clippers and prepared with Betadine scrub and Betadine solution. Sterile drapes were applied. I injected the area to be incised with Marcaine with epinephrine solution. I uses scalpel to make a linear midline incision over the lower thoracic region. I used electrocautery to perform a bilateral subperiosteal dissection exposing the spinous process and lamina from approximately T7 down to T11. We inserted the versa track retractor for exposure. We confirmed our location with intraoperative fluoroscopy.  Under both AP and lateral fluoroscopy I cannulated the bilateral T7, T8, T10 and T11 pedicles with the bone probe. Pedicles were tapped with a 5.5 and 6.5 mm tap. We removed the tap stem probed inside the holes and  noted there was no cortical breaches. We then inserted 6.5 x 45 mm pedicle screws at T7 and T8 bilaterally. We inserted 7.5 x 50 mm pedicle screws into the T10 and T11 pedicles bilaterally. We got good bony purchase. We then connected the unilateral pedicle screws with a likely kyphotic rod. We secured the rod in place with the caps which were tightened appropriately. This completed the instrumentation.  Now turned attention to the posterior arthrodesis at T7-8, T 8-9, T9-10, and T10-11. We used the high-speed drill to decorticate the facets and lamina at T 7, 8, 9, and T10. We laid a combination of local morselized autograft bone we obtained during drilling, Actifusebone extender, and bone morphogenic protein-soaked collagen sponges over these decorticated posterior structures. This completed the arthrodesis.  We then obtained hemostasis using bipolar cautery. We then removed the retractor and reapproximated patient's thoracic fascia with interrupted #1 Vicryl suture. We reapproximated the subcutaneous tissue with interrupted 2-0 Vicryl suture. We reapproximated the skin with Steri-Strips and benzoin. The wound is then coated with bacitracin ointment. A sterile dressing was applied. The drapes were removed. In a patient was subsequently returned to the supine position where was extubated by the anesthesia team. He was then transported to the post anesthesia care unit in stable condition. By report all sponge, instrument, and needle cut counts were correct at the end this case.

## 2012-11-16 NOTE — Progress Notes (Signed)
Patient ID: Jonathan Blackburn, male   DOB: 08/05/1945, 67 y.o.   MRN: [id number] Subjective: The patient is alert and pleasant. He looks well. He is in no apparent distress.  Objective: Vital signs in last 24 hours: Temp:  [97.4 F (36.3 C)-98.6 F (37 C)] 97.6 F (36.4 C) (10/08 1350) Pulse Rate:  [62-78] 71 (10/08 1350) Resp:  [12-22] 22 (10/08 1517) BP: (103-147)/(67-84) 145/78 mmHg (10/08 1350) SpO2:  [94 %-98 %] 94 % (10/08 1517) Weight:  [89.132 kg (196 lb 8 oz)] 89.132 kg (196 lb 8 oz) (10/08 1352)  Intake/Output from previous day:   Intake/Output this shift: Total I/O In: 2050 [I.V.:1800; IV Piggyback:250] Out: 510 [Urine:310; Blood:200]  Physical exam the patient is alert and pleasant. His lower extremity strength is normal. He looks well.  Lab Results: No results found for this basename: WBC, HGB, HCT, PLT,  in the last 72 hours BMET No results found for this basename: NA, K, CL, CO2, GLUCOSE, BUN, CREATININE, CALCIUM,  in the last 72 hours  Studies/Results: Dg Thoracic Spine 4v  11/16/2012   *RADIOLOGY REPORT*  Clinical Data: T7 - T8 and T10 - T11 fusion  DG C-ARM 61-120 MIN,THORACIC SPINE - 4+ VIEW  Comparison:  Thoracic spine CT - 10/04/2012  Findings:  Four spot intraoperative radiographic images of the thoracic spine are provided for review.  Spinal labeling is difficult secondary to the coned field of view.  Images demonstrate the sequela of presumed T7 - T8 and T10 - T11 pedicular screw fixation with sparing of the T9 vertebral body.  Radiopaque surgical support apparatus is seen posterior to the operative site.  IMPRESSION: Post presumed T7 - T8 and T10 - T11 pedicular screw fixation.   Original Report Authenticated By: Tacey Ruiz, MD   Dg C-arm [phone] Min  11/16/2012   *RADIOLOGY REPORT*  Clinical Data: T7 - T8 and T10 - T11 fusion  DG C-ARM 61-120 MIN,THORACIC SPINE - 4+ VIEW  Comparison:  Thoracic spine CT - 10/04/2012  Findings:  Four spot intraoperative  radiographic images of the thoracic spine are provided for review.  Spinal labeling is difficult secondary to the coned field of view.  Images demonstrate the sequela of presumed T7 - T8 and T10 - T11 pedicular screw fixation with sparing of the T9 vertebral body.  Radiopaque surgical support apparatus is seen posterior to the operative site.  IMPRESSION: Post presumed T7 - T8 and T10 - T11 pedicular screw fixation.   Original Report Authenticated By: Tacey Ruiz, MD    Assessment/Plan: The patient is doing well.  LOS: 0 days     Gladyce Mcray D 11/16/2012, 6:11 PM

## 2012-11-16 NOTE — Progress Notes (Signed)
Patient ID: Jonathan Blackburn, male   DOB: 1945/07/24, 67 y.o.   MRN: [id number] Subjective:  The patient is alert and pleasant. His back is appropriately sore. He looks well.  Objective: Vital signs in last 24 hours: Temp:  [97.9 F (36.6 C)-98.6 F (37 C)] 98.6 F (37 C) (10/08 1203) Pulse Rate:  [62-78] 78 (10/08 1203) Resp:  [16] 16 (10/08 1203) BP: (136-147)/(82-84) 136/82 mmHg (10/08 1203) SpO2:  [98 %] 98 % (10/08 0632)  Intake/Output from previous day:   Intake/Output this shift: Total I/O In: 2050 [I.V.:1800; IV Piggyback:250] Out: 460 [Urine:260; Blood:200]  Physical exam the patient is alert and pleasant. He is moving his lower extremities well.  Lab Results: No results found for this basename: WBC, HGB, HCT, PLT,  in the last 72 hours BMET No results found for this basename: NA, K, CL, CO2, GLUCOSE, BUN, CREATININE, CALCIUM,  in the last 72 hours  Studies/Results: No results found.  Assessment/Plan: The patient is doing well.  LOS: 0 days     Sundee Garland D 11/16/2012, 12:30 PM

## 2012-11-16 NOTE — Progress Notes (Signed)
Orthopedic Tech Progress Note Patient Details:  Jonathan Blackburn Feb 17, 1945 [id number]  Patient ID: Jonathan Blackburn, male   DOB: 09-Mar-1945, 67 y.o.   MRN: [id number]   Jonathan Blackburn 11/16/2012, 2:30 PMCalled bio-tech for aspen lumbar corset.

## 2012-11-16 NOTE — Anesthesia Procedure Notes (Signed)
Procedure Name: Intubation Date/Time: 11/16/2012 8:33 AM Performed by: Quentin Ore Pre-anesthesia Checklist: Patient identified, Emergency Drugs available, Suction available, Patient being monitored and Timeout performed Patient Re-evaluated:Patient Re-evaluated prior to inductionOxygen Delivery Method: Circle system utilized Preoxygenation: Pre-oxygenation with 100% oxygen Intubation Type: IV induction Ventilation: Mask ventilation without difficulty Laryngoscope Size: Mac and 3 Grade View: Grade II Tube type: Oral Tube size: 7.5 mm Number of attempts: 1 Airway Equipment and Method: Stylet Placement Confirmation: ETT inserted through vocal cords under direct vision,  positive ETCO2 and breath sounds checked- equal and bilateral Secured at: 23 cm Tube secured with: Tape Dental Injury: Teeth and Oropharynx as per pre-operative assessment

## 2012-11-16 NOTE — Progress Notes (Signed)
Pt requested that Foley be D/C early and for early ambulation also.  Foley DC at 1700, pt DTV by 2300.  Pt ambulated in the room with back brace and rolling walker with minimal assistance from RN.  Pt back to bed without incidence, pt reports relief of pressure from op site due to ambulation.  Will monitor.

## 2012-11-17 ENCOUNTER — Encounter (HOSPITAL_COMMUNITY): Payer: Self-pay | Admitting: Neurosurgery

## 2012-11-17 LAB — CBC
HCT: 42.1 % (ref 39.0–52.0)
Hemoglobin: 14.1 g/dL (ref 13.0–17.0)
MCH: 29.7 pg (ref 26.0–34.0)
Platelets: 238 10*3/uL (ref 150–400)
RBC: 4.74 MIL/uL (ref 4.22–5.81)
WBC: 13.4 10*3/uL — ABNORMAL HIGH (ref 4.0–10.5)

## 2012-11-17 LAB — GLUCOSE, CAPILLARY
Glucose-Capillary: 160 mg/dL — ABNORMAL HIGH (ref 70–99)
Glucose-Capillary: 213 mg/dL — ABNORMAL HIGH (ref 70–99)
Glucose-Capillary: 174 mg/dL — ABNORMAL HIGH (ref 70–99)

## 2012-11-17 LAB — BASIC METABOLIC PANEL
BUN: 23 mg/dL (ref 6–23)
CO2: 23 mEq/L (ref 19–32)
Calcium: 9.2 mg/dL (ref 8.4–10.5)
Creatinine, Ser: 1.41 mg/dL — ABNORMAL HIGH (ref 0.50–1.35)
Sodium: 135 mEq/L (ref 135–145)

## 2012-11-17 MED ORDER — TAMSULOSIN HCL 0.4 MG PO CAPS
0.4000 mg | ORAL_CAPSULE | Freq: Every day | ORAL | Status: DC
  Filled 2012-11-17 (×2): qty 1

## 2012-11-17 MED FILL — Sodium Chloride IV Soln 0.9%: INTRAVENOUS | Qty: 1000 | Status: AC

## 2012-11-17 MED FILL — Heparin Sodium (Porcine) Inj 1000 Unit/ML: INTRAMUSCULAR | Qty: 30 | Status: AC

## 2012-11-17 NOTE — Progress Notes (Signed)
Patient ID: Jonathan Blackburn, male   DOB: 04-Jun-1945, 67 y.o.   MRN: [id number] Subjective:  The patient is alert and pleasant. He looks well. He has some urinary retention last night. He's had some eructation.  Objective: Vital signs in last 24 hours: Temp:  [97.4 F (36.3 C)-100 F (37.8 C)] 98.7 F (37.1 C) (10/09 0514) Pulse Rate:  [71-114] 96 (10/09 1010) Resp:  [13-23] 20 (10/09 1010) BP: (107-145)/(59-82) 107/59 mmHg (10/09 1010) SpO2:  [90 %-100 %] 90 % (10/09 1010) FiO2 (%):  [68 %] 68 % (10/09 0903) Weight:  [89.132 kg (196 lb 8 oz)] 89.132 kg (196 lb 8 oz) (10/08 1352)  Intake/Output from previous day: 10/08 0701 - 10/09 0700 In: 2050 [I.V.:1800; IV Piggyback:250] Out: 1110 [Urine:910; Blood:200] Intake/Output this shift: Total I/O In: -  Out: 100 [Urine:100]  Physical exam the patient is alert and pleasant. His strength is normal. He looks well.  Lab Results:  Recent Labs  11/17/12 0739  WBC 13.4*  HGB 14.1  HCT 42.1  PLT 238   BMET  Recent Labs  11/17/12 0739  NA 135  K 4.0  CL 100  CO2 23  GLUCOSE 200*  BUN 23  CREATININE 1.41*  CALCIUM 9.2    Studies/Results: Dg Thoracic Spine 4v  11/16/2012   *RADIOLOGY REPORT*  Clinical Data: T7 - T8 and T10 - T11 fusion  DG C-ARM 61-120 MIN,THORACIC SPINE - 4+ VIEW  Comparison:  Thoracic spine CT - 10/04/2012  Findings:  Four spot intraoperative radiographic images of the thoracic spine are provided for review.  Spinal labeling is difficult secondary to the coned field of view.  Images demonstrate the sequela of presumed T7 - T8 and T10 - T11 pedicular screw fixation with sparing of the T9 vertebral body.  Radiopaque surgical support apparatus is seen posterior to the operative site.  IMPRESSION: Post presumed T7 - T8 and T10 - T11 pedicular screw fixation.   Original Report Authenticated By: Tacey Ruiz, MD   Dg C-arm [phone] Min  11/16/2012   *RADIOLOGY REPORT*  Clinical Data: T7 - T8 and T10 - T11 fusion  DG  C-ARM 61-120 MIN,THORACIC SPINE - 4+ VIEW  Comparison:  Thoracic spine CT - 10/04/2012  Findings:  Four spot intraoperative radiographic images of the thoracic spine are provided for review.  Spinal labeling is difficult secondary to the coned field of view.  Images demonstrate the sequela of presumed T7 - T8 and T10 - T11 pedicular screw fixation with sparing of the T9 vertebral body.  Radiopaque surgical support apparatus is seen posterior to the operative site.  IMPRESSION: Post presumed T7 - T8 and T10 - T11 pedicular screw fixation.   Original Report Authenticated By: Tacey Ruiz, MD    Assessment/Plan: Postop day 1: The patient is doing well logical he in mobilizing well.  Urinary retention: His urinary output has been marginal. We will continue to monitor this and used a bladder scan as necessary. We may need to reinsert the Foley. I'll add Flomax. I will recheck his BMP tomorrow.  Eructation: We discussed adding Thorazine but the patient prefers observation.  LOS: 1 day     Garnet Chatmon D 11/17/2012, 12:47 PM

## 2012-11-17 NOTE — Progress Notes (Signed)
INITIAL NUTRITION ASSESSMENT  DOCUMENTATION CODES Per approved criteria  -Not Applicable   INTERVENTION: No nutrition intervention at this time ---> patient declined RD to follow for nutrition care plan  NUTRITION DIAGNOSIS: Inadequate oral intake related to decreased appetite as evidenced by patient report  Goal: Pt to meet >/= 90% of their estimated nutrition needs   Monitor:  PO intake, weight, labs, I/O's  Reason for Assessment: Malnutrition Screening Tool Report  67 y.o. male  Admitting Dx: T9 chance fracture, thoracic pain  ASSESSMENT: Patient s/p t7-11 fusion with previous fall from tree in March 2014; suffering a T9 chance fracture.   Patient s/p procedure 10/8: T7-T11 POSTERIOR ARTHRODESIS WITH LOCAL MORSELIZED AUTOGRAFT BONE ACTIFUSE BONE GRAFT EXTENDER T7-T11 POSTERIOR SEGMENTAL INSTRUMENTATION WITH GLOBUS TITANIUM PEDICLE SCREWS & RODS  Patient states his appetite is OK; wasn't very good; reports no recent weight loss; RD offered addition of nutrition supplements, however, patient declined.  Height: Ht Readings from Last 1 Encounters:  11/16/12 5\' 8"  (1.727 m)    Weight: Wt Readings from Last 1 Encounters:  11/16/12 196 lb 8 oz (89.132 kg)    Ideal Body Weight: 154 lb  % Ideal Body Weight: 127%  Wt Readings from Last 10 Encounters:  11/16/12 196 lb 8 oz (89.132 kg)  11/16/12 196 lb 8 oz (89.132 kg)  11/02/12 181 lb 3.2 oz (82.192 kg)    Usual Body Weight: 181 lb  % Usual Body Weight:108%  BMI:  Body mass index is 29.88 kg/(m^2).  Estimated Nutritional Needs: Kcal: 2200-2400 Protein: 110-120 gm Fluid: 2.2-2.4 L  Skin: back surgical incision   Diet Order: Carb Control  EDUCATION NEEDS: -No education needs identified at this time   Intake/Output Summary (Last 24 hours) at 11/17/12 1430 Last data filed at 11/17/12 1110  Gross per 24 hour  Intake      0 ml  Output    700 ml  Net   -700 ml    Labs:   Recent Labs Lab  11/17/12 0739  NA 135  K 4.0  CL 100  CO2 23  BUN 23  CREATININE 1.41*  CALCIUM 9.2  GLUCOSE 200*    CBG (last 3)   Recent Labs  11/17/12 0422 11/17/12 0802 11/17/12 1205  GLUCAP 213* 208* 174*    Scheduled Meds: . amLODipine  5 mg Oral Daily  . bupivacaine liposome  20 mL Infiltration Once  . docusate sodium  100 mg Oral BID  . fenofibrate  160 mg Oral Daily  . insulin aspart  0-15 Units Subcutaneous Q4H  . lisinopril  20 mg Oral Daily  . metFORMIN  500 mg Oral Q breakfast  . tamsulosin  0.4 mg Oral Daily    Continuous Infusions: . lactated ringers 75 mL/hr at 11/17/12 0450    Past Medical History  Diagnosis Date  . Diabetes mellitus without complication   . Hypertension   . Heart murmur   . Arthritis     Past Surgical History  Procedure Laterality Date  . Back surgery    . Shoulder arthroscopy      rotator cuff  . Posterior lumbar fusion 4 level N/A 11/16/2012    Procedure: Thoracic seven to thoracic eleven posterior lateral thoracic arthrodesis with instrumentation and bone graft ;  Surgeon: Cristi Loron, MD;  Location: [facility];  Service: Neurosurgery;  Laterality: N/A;  Thoracic seven to thoracic eleven posterior lateral thoracic arthrodesis with instrumentation and bone graft    Maureen Chatters, RD, LDN  Pager #: [phone] After-Hours Pager #: [phone]

## 2012-11-17 NOTE — Evaluation (Signed)
Physical Therapy Evaluation Patient Details Name: Indio Santilli MRN: [id number] DOB: July 12, 1945 Today's Date: 11/17/2012 Time: 7829-5621 PT Time Calculation (min): 28 min  PT Assessment / Plan / Recommendation History of Present Illness  67 yo male s/p t7-11 fusion with previous fall from tree in March 2014. Suffering a T9 chance fracture.   Clinical Impression  Patient demonstrates deficits in functional mobility as indicated below. Pt will benefit from continued skilled PT to address deficits and maximize independence. Will continue to see as indicated. Rec HHPT upon discharge with family supervision and PRN assist.      PT Assessment  Patient needs continued PT services    Follow Up Recommendations  Home health PT;Supervision/Assistance - 24 hour          Equipment Recommendations  Rolling walker with 5" wheels    Recommendations for Other Services     Frequency Min 4X/week    Precautions / Restrictions Precautions Precautions: Back Precaution Comments: handout provided and reviewed. Girlfriend present and educated due to patients' lethargic state Required Braces or Orthoses: Spinal Brace Spinal Brace: Lumbar corset;Applied in sitting position (old TLSO brace in closet from March 2014)   Pertinent Vitals/Pain 8/10 pain      Mobility  Bed Mobility Bed Mobility: Sit to Supine Sit to Supine: 3: Mod assist;HOB flat;With rail Details for Bed Mobility Assistance: (A) with sequence and back precautions Transfers Transfers: Sit to Stand;Stand to Sit Sit to Stand: 4: Min assist;With upper extremity assist;From chair/3-in-1 Stand to Sit: 4: Min assist;With upper extremity assist;To bed Details for Transfer Assistance: educated on sequence and hand placement Ambulation/Gait Ambulation/Gait Assistance: 4: Min guard Ambulation Distance (Feet): 300 Feet Assistive device: Rolling walker Ambulation/Gait Assistance Details: VCs for upright posture, patient very slow and guarded  with gait Gait Pattern: Step-through pattern;Decreased stride length Gait velocity: decreased General Gait Details: steady but slow    Exercises     PT Diagnosis: Difficulty walking;Abnormality of gait;Acute pain  PT Problem List: Decreased range of motion;Decreased activity tolerance;Decreased balance;Decreased mobility;Decreased knowledge of use of DME PT Treatment Interventions: DME instruction;Gait training;Stair training;Functional mobility training;Therapeutic activities;Therapeutic exercise;Balance training;Patient/family education     PT Goals(Current goals can be found in the care plan section) Acute Rehab PT Goals Patient Stated Goal: to return to playing golf PT Goal Formulation: With patient Time For Goal Achievement: 12/01/12 Potential to Achieve Goals: Good  Visit Information  Last PT Received On: 11/17/12 Assistance Needed: +1 History of Present Illness: 67 yo male s/p t7-11 fusion with previous fall from tree in March 2014. Suffering a T9 chance fracture.        Prior Functioning  Home Living Family/patient expects to be discharged to:: Private residence Living Arrangements: Spouse/significant other Available Help at Discharge: Available 24 hours/day Type of Home: House Home Access: Stairs to enter [employer] of Steps: 2 Entrance Stairs-Rails: None Home Layout: Two level;Bed/bath upstairs;Able to live on main level with bedroom/bathroom (bedroom and bathroom) Alternate Level Stairs-Number of Steps: 10-12 Alternate Level Stairs-Rails: Can reach both Home Equipment: Cane - single point (TLSO brace) Prior Function Level of Independence: Independent Communication Communication: No difficulties Dominant Hand: Right    Cognition  Cognition Arousal/Alertness: Lethargic Behavior During Therapy: Flat affect Overall Cognitive Status: Within Functional Limits for tasks assessed Memory: Decreased short-term memory (due to medications)     Extremity/Trunk Assessment Upper Extremity Assessment Upper Extremity Assessment: Overall WFL for tasks assessed Lower Extremity Assessment Lower Extremity Assessment: Overall WFL for tasks assessed Cervical / Trunk Assessment Cervical /  Trunk Assessment: Normal   Balance Balance Balance Assessed: Yes Static Standing Balance Static Standing - Balance Support: Bilateral upper extremity supported Static Standing - Level of Assistance: 5: Stand by assistance Static Standing - Comment/# of Minutes: 4 minutes  End of Session PT - End of Session Equipment Utilized During Treatment: Gait belt;Back brace Activity Tolerance: Patient limited by fatigue;Patient limited by pain Patient left: in chair;with call bell/phone within reach;with family/visitor present Nurse Communication: Mobility status;Patient requests pain meds (Brace size too big)  GP     Fabio Asa 11/17/2012, 4:00 PM  Charlotte Crumb, PT DPT  [phone]

## 2012-11-17 NOTE — Evaluation (Signed)
Occupational Therapy Evaluation Patient Details Name: Jonathan Blackburn MRN: [id number] DOB: November 02, 1945 Today's Date: 11/17/2012 Time: 4098-1191 OT Time Calculation (min): 30 min  OT Assessment / Plan / Recommendation History of present illness 67 yo male s/p t7-11 fusion with previous fall from tree in March 2014. Suffering a T9 chance fracture.    Clinical Impression   Patient is s/p T7-11 surgery resulting in functional limitations due to the deficits listed below (see OT problem list).  Patient will benefit from skilled OT acutely to increase independence and safety with ADLS to allow discharge HHOT. Pt very groggy during session and education limited due to arousal levels. Home equipment needs to be further assessed.     OT Assessment  Patient needs continued OT Services    Follow Up Recommendations  Home health OT;Supervision - Intermittent    Barriers to Discharge      Equipment Recommendations  Other (comment) (TBA )    Recommendations for Other Services    Frequency  Min 2X/week    Precautions / Restrictions Precautions Precautions: Back Precaution Comments: handout provided and reviewed. Girlfriend present and educated due to patients' lethargic state Required Braces or Orthoses: Spinal Brace Spinal Brace: Lumbar corset;Applied in sitting position (old TLSO brace in closet from March 2014)   Pertinent Vitals/Pain 2-3 out 10 using PCA Very groggy on pCA and unable to void bladder    ADL  Lower Body Dressing: +1 Total assistance Where Assessed - Lower Body Dressing: Supported sit to stand Toilet Transfer: Minimal assistance Toilet Transfer Method: Sit to stand Toilet Transfer Equipment: Raised toilet seat with arms (or 3-in-1 over toilet) Equipment Used: Back brace;Rolling walker Transfers/Ambulation Related to ADLs: Pt needed extended time to for transfer and (A) to turn RW at this time. Pt very groggy this AM and closing eyes during session sitting.  ADL Comments: Pt  educated on back precautions and hand out provided. Pt closing eyes and decr arousal sitting. pt repositioned supine for nap. Pt and family concerned about too much medication making patient groggy from PCA. educated pt on speaking with RN about oral medications. Pt asking and concerned about inability to void bladder. Pt with attempt this AM and unsuccessful.     OT Diagnosis: Generalized weakness;Acute pain  OT Problem List: Decreased strength;Decreased activity tolerance;Impaired balance (sitting and/or standing);Decreased safety awareness;Decreased knowledge of use of DME or AE;Decreased knowledge of precautions;Pain OT Treatment Interventions: Self-care/ADL training;Therapeutic exercise;DME and/or AE instruction;Therapeutic activities;Patient/family education;Balance training   OT Goals(Current goals can be found in the care plan section) Acute Rehab OT Goals Patient Stated Goal: to return to playing golf OT Goal Formulation: With patient Time For Goal Achievement: 12/01/12 Potential to Achieve Goals: Good  Visit Information  Last OT Received On: 11/17/12 Assistance Needed: +1 History of Present Illness: 67 yo male s/p t7-11 fusion with previous fall from tree in March 2014. Suffering a T9 chance fracture.        Prior Functioning     Home Living Family/patient expects to be discharged to:: Private residence Living Arrangements: Spouse/significant other Available Help at Discharge: Available 24 hours/day Type of Home: House Home Access: Stairs to enter [employer] of Steps: 2 Entrance Stairs-Rails: None Home Layout: Two level;Bed/bath upstairs;Able to live on main level with bedroom/bathroom (bedroom and bathroom) Alternate Level Stairs-Number of Steps: 10-12 Alternate Level Stairs-Rails: Can reach both Home Equipment: Cane - single point (TLSO brace) Prior Function Level of Independence: Independent Communication Communication: No difficulties Dominant Hand:  Right  Vision/Perception Vision - History Baseline Vision: Wears glasses only for reading Patient Visual Report: No change from baseline   Cognition  Cognition Arousal/Alertness: Lethargic Behavior During Therapy: Flat affect Overall Cognitive Status: Within Functional Limits for tasks assessed Memory: Decreased short-term memory (due to medications)    Extremity/Trunk Assessment Upper Extremity Assessment Upper Extremity Assessment: Overall WFL for tasks assessed Lower Extremity Assessment Lower Extremity Assessment: Defer to PT evaluation Cervical / Trunk Assessment Cervical / Trunk Assessment: Normal     Mobility Bed Mobility Bed Mobility: Sit to Supine Sit to Supine: 3: Mod assist;HOB flat;With rail Details for Bed Mobility Assistance: (A) with sequence and back precautions Transfers Transfers: Sit to Stand;Stand to Sit Sit to Stand: 4: Min assist;With upper extremity assist;From chair/3-in-1 Stand to Sit: 4: Min assist;With upper extremity assist;To bed Details for Transfer Assistance: educated on sequence and hand placement     Exercise     Balance     End of Session OT - End of Session Activity Tolerance: Patient limited by lethargy Patient left: in bed;with call bell/phone within reach;with family/visitor present Nurse Communication: Mobility status;Precautions  GO     Harolyn Rutherford 11/17/2012, 9:04 AM Pager: [phone]

## 2012-11-17 NOTE — Progress Notes (Signed)
Patient unable to void after multiple attempts. Bladder scan performed, showed 478cc.  I & O cath done, 600cc clear, yellow urine out.  Patient tolerated well and stated he feels "relieved".  Will continue to monitor and attempt to void.

## 2012-11-18 LAB — GLUCOSE, CAPILLARY
Glucose-Capillary: 154 mg/dL — ABNORMAL HIGH (ref 70–99)
Glucose-Capillary: 171 mg/dL — ABNORMAL HIGH (ref 70–99)
Glucose-Capillary: 175 mg/dL — ABNORMAL HIGH (ref 70–99)
Glucose-Capillary: 176 mg/dL — ABNORMAL HIGH (ref 70–99)

## 2012-11-18 LAB — BASIC METABOLIC PANEL
BUN: 16 mg/dL (ref 6–23)
CO2: 23 mEq/L (ref 19–32)
Calcium: 9.2 mg/dL (ref 8.4–10.5)
Chloride: 98 mEq/L (ref 96–112)
Creatinine, Ser: 1.11 mg/dL (ref 0.50–1.35)
Potassium: 3.9 mEq/L (ref 3.5–5.1)

## 2012-11-18 MED ORDER — DIAZEPAM 5 MG PO TABS: 5.0000 mg | ORAL_TABLET | Freq: Four times a day (QID) | ORAL | Status: DC | PRN

## 2012-11-18 MED ORDER — DSS 100 MG PO CAPS: 100.0000 mg | ORAL_CAPSULE | Freq: Two times a day (BID) | ORAL | Status: DC

## 2012-11-18 MED ORDER — OXYCODONE-ACETAMINOPHEN 5-325 MG PO TABS: 1.0000 | ORAL_TABLET | ORAL | Status: DC | PRN

## 2012-11-18 NOTE — Progress Notes (Signed)
Occupational Therapy Treatment Patient Details Name: Jonathan Blackburn MRN: [id number] DOB: 08/15/1945 Today's Date: 11/18/2012 Time: 7829-5621 OT Time Calculation (min): 30 min  OT Assessment / Plan / Recommendation  History of present illness 67 yo male s/p t7-11 fusion with previous fall from tree in March 2014. Suffering a T9 chance fracture.    OT comments  Pt is at adequate level for d/c home at this time. Pt able to complete adl at sink level with incr time. Pt requesting pain medication and RN informed.   Follow Up Recommendations  Home health OT;Supervision - Intermittent    Barriers to Discharge       Equipment Recommendations  None recommended by OT    Recommendations for Other Services    Frequency Min 2X/week   Progress towards OT Goals Progress towards OT goals: Progressing toward goals  Plan Discharge plan remains appropriate    Precautions / Restrictions Precautions Precautions: Back Precaution Comments: pt verbalized bending twisting and lifting. Pt needed re education on arching Required Braces or Orthoses: Spinal Brace Spinal Brace: Lumbar corset;Applied in sitting position   Pertinent Vitals/Pain 8 out 10 "i think I need a percocet"    ADL  Grooming: Wash/dry hands;Wash/dry face;Min guard Where Assessed - Grooming: Unsupported standing Upper Body Bathing: Chest;Right arm;Left arm;Abdomen;Min guard Where Assessed - Upper Body Bathing: Unsupported sit to stand Lower Body Bathing: Min guard Where Assessed - Lower Body Bathing: Unsupported sit to stand Upper Body Dressing: Modified independent Where Assessed - Upper Body Dressing: Supported sitting Lower Body Dressing: Supervision/safety Where Assessed - Lower Body Dressing: Unsupported sit to stand (AE ( reacher) used) Statistician: Radiographer, therapeutic Method: Sit to Barista: Raised toilet seat with arms (or 3-in-1 over toilet) Equipment Used: Back brace;Rolling  walker Transfers/Ambulation Related to ADLs: pt with v/c for hand placement prior to sit<>Stand ADL Comments: Pt completed full adl at sink level in prep for home. Pt don clothing for home with reacher. Pt has a Sports administrator for home use. Pt verbalized and demo don of brace.  Pt will have (A) of girlfriend for d/c home. Pt and girlfriend without questions at this time    OT Diagnosis:    OT Problem List:   OT Treatment Interventions:     OT Goals(current goals can now be found in the care plan section) Acute Rehab OT Goals Patient Stated Goal: to return to playing golf OT Goal Formulation: With patient Time For Goal Achievement: 12/01/12 Potential to Achieve Goals: Good ADL Goals Pt Will Perform Grooming: with supervision;standing Pt Will Perform Upper Body Dressing: with supervision;standing Pt Will Perform Lower Body Dressing: with supervision;with adaptive equipment;sit to/from stand Pt Will Transfer to Toilet: with supervision;regular height toilet Additional ADL Goal #1: Pt will don doff brace MOD I   Visit Information  Last OT Received On: 11/18/12 Assistance Needed: +1 History of Present Illness: 67 yo male s/p t7-11 fusion with previous fall from tree in March 2014. Suffering a T9 chance fracture.     Subjective Data      Prior Functioning       Cognition  Cognition Arousal/Alertness: Awake/alert Behavior During Therapy: WFL for tasks assessed/performed Overall Cognitive Status: Within Functional Limits for tasks assessed    Mobility  Bed Mobility Bed Mobility: Supine to Sit;Sitting - Scoot to Edge of Bed;Sit to Supine Supine to Sit: 4: Min assist;HOB flat Sitting - Scoot to Edge of Bed: 4: Min guard Sit to Supine: 4: Min guard;HOB flat Details for  Bed Mobility Assistance: Wife and patient had gotten up to recliner for breakfast. The verbalized correct technique Transfers Sit to Stand: 5: Supervision;With upper extremity assist;From bed Stand to Sit: 5:  Supervision;With upper extremity assist;To bed    Exercises      Balance     End of Session OT - End of Session Activity Tolerance: Patient tolerated treatment well Patient left: in bed;with call bell/phone within reach;with family/visitor present Nurse Communication: Mobility status;Precautions  GO     Harolyn Rutherford 11/18/2012, 9:22 AM Pager: [phone]

## 2012-11-18 NOTE — Discharge Summary (Signed)
Physician Discharge Summary  Patient ID: Jonathan Blackburn MRN: [id number] DOB/AGE: 10/22/1945 67 y.o.  Admit date: 11/16/2012 Discharge date: 11/18/2012  Admission Diagnoses: T9 fracture, thoracic pain  Discharge Diagnoses: The same Active Problems:   * No active hospital problems. *   Discharged Condition: good  Hospital Course: I performed at T7-T11 posterior thoracic instrumentation and fusion on 11/16/2012. The surgery went well.  Patient's postoperative course was remarkable only for urinary retention requiring in and out catheterization. The patient was started on Flomax and this resolved.  On 11/18/2012 the patient requested discharge to home. He was given oral and written discharge instructions. All his questions were answered.  Consults: None Significant Diagnostic Studies: None Treatments: T7-T11 thoracic instrumentation and fusion. Discharge Exam: Blood pressure 106/71, pulse 101, temperature 100.5 F (38.1 C), temperature source Oral, resp. rate 20, height 5\' 8"  (1.727 m), weight 89.132 kg (196 lb 8 oz), SpO2 90.00%. Patient is alert and pleasant. He looks well. He is in no apparent distress. His strength is normal his lower extremities. His dressing is clean and dry.  Disposition: Home  Discharge Orders   Future Orders Complete By Expires   Call MD for:  difficulty breathing, headache or visual disturbances  As directed    Call MD for:  extreme fatigue  As directed    Call MD for:  hives  As directed    Call MD for:  persistant dizziness or light-headedness  As directed    Call MD for:  persistant nausea and vomiting  As directed    Call MD for:  redness, tenderness, or signs of infection (pain, swelling, redness, odor or green/yellow discharge around incision site)  As directed    Call MD for:  severe uncontrolled pain  As directed    Call MD for:  temperature >100.4  As directed    Diet - low sodium heart healthy  As directed    Discharge instructions  As  directed    Comments:     Call [phone] for a followup appointment. Take a stool softener while you are using pain medications.   Driving Restrictions  As directed    Comments:     Do not drive for 2 weeks.   Increase activity slowly  As directed    Lifting restrictions  As directed    Comments:     Do not lift more than 5 pounds. No excessive bending or twisting.   May shower / Bathe  As directed    Comments:     He may shower after the pain she is removed 3 days after surgery. Leave the incision alone.   Remove dressing in 24 hours  As directed        Medication List         amLODipine 5 MG tablet  Commonly known as:  NORVASC  Take 5 mg by mouth daily.     aspirin EC 81 MG tablet  Take 81 mg by mouth daily.     calcium carbonate 1250 MG tablet  Commonly known as:  OS-CAL - dosed in mg of elemental calcium  Take 1 tablet by mouth daily with breakfast.     diazepam 5 MG tablet  Commonly known as:  VALIUM  Take 1 tablet (5 mg total) by mouth every 6 (six) hours as needed.     DSS 100 MG Caps  Take 100 mg by mouth 2 (two) times daily.     fenofibrate micronized 134 MG capsule  Commonly known  as:  LOFIBRA  Take 134 mg by mouth daily before breakfast.     lisinopril 20 MG tablet  Commonly known as:  PRINIVIL,ZESTRIL  Take 20 mg by mouth daily.     metFORMIN 500 MG 24 hr tablet  Commonly known as:  GLUCOPHAGE-XR  Take 500 mg by mouth daily with breakfast.     oxyCODONE-acetaminophen 5-325 MG per tablet  Commonly known as:  PERCOCET/ROXICET  Take 1-2 tablets by mouth every 4 (four) hours as needed.     Vitamin D3 5000 UNITS Caps  Take 5,000 Units by mouth daily.         SignedCristi Loron 11/18/2012, 7:37 AM

## 2012-11-18 NOTE — Progress Notes (Signed)
Patient out of bed walking in room without back brace on.Encouraged patient to put brace on .

## 2012-11-18 NOTE — Progress Notes (Signed)
Physical Therapy Treatment Patient Details Name: Jonathan Blackburn MRN: [id number] DOB: 1946-01-31 Today's Date: 11/18/2012 Time: 7829-5621 PT Time Calculation (min): 24 min  PT Assessment / Plan / Recommendation  History of Present Illness 67 yo male s/p t7-11 fusion with previous fall from tree in March 2014. Suffering a T9 chance fracture.    PT Comments   Patient able to tolerate stair training without difficulty. Bedroom on the second floor but does have option to sleep on lower level. Patient eager to DC home today. Mobility and precautions reviewed. Anticipate DC later today. Will need RW prior to DC   Follow Up Recommendations  Home health PT;Supervision/Assistance - 24 hour     Does the patient have the potential to tolerate intense rehabilitation     Barriers to Discharge        Equipment Recommendations  Rolling walker with 5" wheels    Recommendations for Other Services    Frequency Min 4X/week   Progress towards PT Goals Progress towards PT goals: Progressing toward goals  Plan Current plan remains appropriate    Precautions / Restrictions Precautions Precautions: Back Precaution Comments: Paitent able to recall all precautions without cues Required Braces or Orthoses: Spinal Brace Spinal Brace: Lumbar corset;Applied in sitting position   Pertinent Vitals/Pain 5/10 Back pain. patient repositioned for comfort     Mobility  Bed Mobility Bed Mobility: Not assessed Details for Bed Mobility Assistance: Wife and patient had gotten up to recliner for breakfast. The verbalized correct technique Transfers Sit to Stand: 5: Supervision Stand to Sit: 5: Supervision Ambulation/Gait Ambulation/Gait Assistance: 5: Supervision Ambulation Distance (Feet): 350 Feet Assistive device: Rolling walker Ambulation/Gait Assistance Details: Cues for upright posture Gait Pattern: Step-through pattern;Decreased stride length Gait velocity: decreased General Gait Details:  guarded Stairs: Yes Stairs Assistance: 4: Min guard Stair Management Technique: Step to pattern;Sideways;One rail Right Number of Stairs: 10    Exercises     PT Diagnosis:    PT Problem List:   PT Treatment Interventions:     PT Goals (current goals can now be found in the care plan section)    Visit Information  Last PT Received On: 11/18/12 Assistance Needed: +1 History of Present Illness: 67 yo male s/p t7-11 fusion with previous fall from tree in March 2014. Suffering a T9 chance fracture.     Subjective Data      Cognition  Cognition Arousal/Alertness: Awake/alert Behavior During Therapy: WFL for tasks assessed/performed Overall Cognitive Status: Within Functional Limits for tasks assessed    Balance     End of Session PT - End of Session Equipment Utilized During Treatment: Gait belt;Back brace Activity Tolerance: Patient tolerated treatment well Patient left: in chair;with call bell/phone within reach Nurse Communication: Mobility status   GP     Fredrich Birks 11/18/2012, 8:25 AM  11/18/2012 Fredrich Birks PTA [phone] pager [phone] office

## 2012-11-18 NOTE — Progress Notes (Signed)
Talked to patient with girlfriend Jenny Reichmann [phone]) present; CM offered home health care choices, patient chose [facility]; Darlin Drop with [facility] called for arrangements. Also [facility] called for the delivery of the rolling walker. Patient stated that he will be returning to his home at [facility] at discharge; B Ave Filter RN,BSN,MHA

## 2013-04-12 ENCOUNTER — Other Ambulatory Visit: Payer: Self-pay | Admitting: Urology

## 2013-04-13 ENCOUNTER — Encounter (HOSPITAL_COMMUNITY): Payer: Self-pay | Admitting: Pharmacy Technician

## 2013-04-21 ENCOUNTER — Ambulatory Visit: Payer: Self-pay | Admitting: Physician Assistant

## 2013-04-24 ENCOUNTER — Ambulatory Visit (HOSPITAL_COMMUNITY): Admission: RE | Admit: 2013-04-24 | Payer: Medicare Other | Source: Ambulatory Visit | Admitting: Urology

## 2013-04-24 ENCOUNTER — Encounter (HOSPITAL_COMMUNITY): Admission: RE | Payer: Self-pay | Source: Ambulatory Visit

## 2013-04-24 SURGERY — LITHOTRIPSY, ESWL
Anesthesia: LOCAL | Laterality: Right

## 2013-05-01 ENCOUNTER — Other Ambulatory Visit: Payer: Self-pay | Admitting: Urology

## 2013-05-21 ENCOUNTER — Inpatient Hospital Stay (HOSPITAL_COMMUNITY): Payer: Medicare Other

## 2013-05-21 ENCOUNTER — Encounter (HOSPITAL_COMMUNITY): Payer: Self-pay | Admitting: Emergency Medicine

## 2013-05-21 ENCOUNTER — Inpatient Hospital Stay (HOSPITAL_COMMUNITY)
Admission: EM | Admit: 2013-05-21 | Discharge: 2013-05-26 | DRG: 290 | Disposition: A | Discharge status: Home Health | Payer: Medicare Other | Attending: Internal Medicine | Admitting: Internal Medicine

## 2013-05-21 DIAGNOSIS — I330 Acute and subacute infective endocarditis: Secondary | ICD-10-CM

## 2013-05-21 DIAGNOSIS — R9389 Abnormal findings on diagnostic imaging of other specified body structures: Secondary | ICD-10-CM | POA: Diagnosis present

## 2013-05-21 DIAGNOSIS — K5909 Other constipation: Secondary | ICD-10-CM | POA: Diagnosis not present

## 2013-05-21 DIAGNOSIS — Z860100 Personal history of colon polyps, unspecified: Secondary | ICD-10-CM

## 2013-05-21 DIAGNOSIS — M545 Low back pain, unspecified: Secondary | ICD-10-CM | POA: Diagnosis present

## 2013-05-21 DIAGNOSIS — I10 Essential (primary) hypertension: Secondary | ICD-10-CM | POA: Diagnosis present

## 2013-05-21 DIAGNOSIS — R7881 Bacteremia: Secondary | ICD-10-CM

## 2013-05-21 DIAGNOSIS — K047 Periapical abscess without sinus: Secondary | ICD-10-CM | POA: Diagnosis present

## 2013-05-21 DIAGNOSIS — Z981 Arthrodesis status: Secondary | ICD-10-CM

## 2013-05-21 DIAGNOSIS — IMO0001 Reserved for inherently not codable concepts without codable children: Secondary | ICD-10-CM | POA: Diagnosis present

## 2013-05-21 DIAGNOSIS — R509 Fever, unspecified: Secondary | ICD-10-CM | POA: Diagnosis present

## 2013-05-21 DIAGNOSIS — N200 Calculus of kidney: Secondary | ICD-10-CM | POA: Diagnosis present

## 2013-05-21 DIAGNOSIS — K8020 Calculus of gallbladder without cholecystitis without obstruction: Secondary | ICD-10-CM | POA: Diagnosis present

## 2013-05-21 DIAGNOSIS — B952 Enterococcus as the cause of diseases classified elsewhere: Secondary | ICD-10-CM | POA: Diagnosis present

## 2013-05-21 DIAGNOSIS — I340 Nonrheumatic mitral (valve) insufficiency: Secondary | ICD-10-CM

## 2013-05-21 DIAGNOSIS — R933 Abnormal findings on diagnostic imaging of other parts of digestive tract: Secondary | ICD-10-CM

## 2013-05-21 DIAGNOSIS — Z7982 Long term (current) use of aspirin: Secondary | ICD-10-CM

## 2013-05-21 DIAGNOSIS — A491 Streptococcal infection, unspecified site: Secondary | ICD-10-CM | POA: Diagnosis present

## 2013-05-21 DIAGNOSIS — E119 Type 2 diabetes mellitus without complications: Secondary | ICD-10-CM | POA: Diagnosis present

## 2013-05-21 DIAGNOSIS — R5383 Other fatigue: Secondary | ICD-10-CM

## 2013-05-21 DIAGNOSIS — E1165 Type 2 diabetes mellitus with hyperglycemia: Secondary | ICD-10-CM

## 2013-05-21 DIAGNOSIS — Z9889 Other specified postprocedural states: Secondary | ICD-10-CM

## 2013-05-21 DIAGNOSIS — K0530 Chronic periodontitis, unspecified: Secondary | ICD-10-CM | POA: Diagnosis present

## 2013-05-21 DIAGNOSIS — Z79899 Other long term (current) drug therapy: Secondary | ICD-10-CM

## 2013-05-21 DIAGNOSIS — I959 Hypotension, unspecified: Secondary | ICD-10-CM | POA: Diagnosis present

## 2013-05-21 DIAGNOSIS — K639 Disease of intestine, unspecified: Secondary | ICD-10-CM

## 2013-05-21 DIAGNOSIS — G8929 Other chronic pain: Secondary | ICD-10-CM | POA: Diagnosis present

## 2013-05-21 DIAGNOSIS — R5381 Other malaise: Secondary | ICD-10-CM | POA: Diagnosis present

## 2013-05-21 DIAGNOSIS — I39 Endocarditis and heart valve disorders in diseases classified elsewhere: Secondary | ICD-10-CM

## 2013-05-21 DIAGNOSIS — I339 Acute and subacute endocarditis, unspecified: Secondary | ICD-10-CM

## 2013-05-21 LAB — COMPREHENSIVE METABOLIC PANEL
ALT: 21 U/L (ref 0–53)
AST: 23 U/L (ref 0–37)
Albumin: 3.6 g/dL (ref 3.5–5.2)
Alkaline Phosphatase: 73 U/L (ref 39–117)
BILIRUBIN TOTAL: 0.5 mg/dL (ref 0.3–1.2)
BUN: 28 mg/dL — ABNORMAL HIGH (ref 6–23)
CHLORIDE: 102 meq/L (ref 96–112)
CO2: 21 meq/L (ref 19–32)
CREATININE: 0.94 mg/dL (ref 0.50–1.35)
Calcium: 9.6 mg/dL (ref 8.4–10.5)
GFR, EST NON AFRICAN AMERICAN: 85 mL/min — AB (ref >90)
GLUCOSE: 152 mg/dL — AB (ref 70–99)
Potassium: 4 mEq/L (ref 3.7–5.3)
Sodium: 141 mEq/L (ref 137–147)
Total Protein: 7.3 g/dL (ref 6.0–8.3)

## 2013-05-21 LAB — URINALYSIS, ROUTINE W REFLEX MICROSCOPIC
BILIRUBIN URINE: NEGATIVE
Glucose, UA: NEGATIVE mg/dL
HGB URINE DIPSTICK: NEGATIVE
Ketones, ur: NEGATIVE mg/dL
Leukocytes, UA: NEGATIVE
NITRITE: NEGATIVE
Protein, ur: NEGATIVE mg/dL
SPECIFIC GRAVITY, URINE: 1.024 (ref 1.005–1.030)
Urobilinogen, UA: 0.2 mg/dL (ref 0.0–1.0)
pH: 5 (ref 5.0–8.0)

## 2013-05-21 LAB — CBC WITH DIFFERENTIAL/PLATELET
Basophils Absolute: 0 10*3/uL (ref 0.0–0.1)
Basophils Relative: 0 % (ref 0–1)
Eosinophils Absolute: 0.1 10*3/uL (ref 0.0–0.7)
Eosinophils Relative: 1 % (ref 0–5)
HEMATOCRIT: 41.8 % (ref 39.0–52.0)
HEMOGLOBIN: 14.2 g/dL (ref 13.0–17.0)
LYMPHS ABS: 0.7 10*3/uL (ref 0.7–4.0)
LYMPHS PCT: 8 % — AB (ref 12–46)
MCH: 27.9 pg (ref 26.0–34.0)
MCHC: 34 g/dL (ref 30.0–36.0)
MCV: 82.1 fL (ref 78.0–100.0)
MONO ABS: 0.8 10*3/uL (ref 0.1–1.0)
Monocytes Relative: 9 % (ref 3–12)
Neutro Abs: 7.5 10*3/uL (ref 1.7–7.7)
Neutrophils Relative %: 82 % — ABNORMAL HIGH (ref 43–77)
Platelets: 222 10*3/uL (ref 150–400)
RBC: 5.09 MIL/uL (ref 4.22–5.81)
RDW: 14.9 % (ref 11.5–15.5)
WBC: 9.2 10*3/uL (ref 4.0–10.5)

## 2013-05-21 MED ORDER — ACETAMINOPHEN 325 MG PO TABS
650.0000 mg | ORAL_TABLET | Freq: Four times a day (QID) | ORAL | Status: DC | PRN
  Administered 2013-05-22 – 2013-05-25 (×4): 650 mg via ORAL
  Filled 2013-05-21 (×4): qty 2

## 2013-05-21 MED ORDER — HYDROCODONE-ACETAMINOPHEN 5-325 MG PO TABS: 1.0000 | ORAL_TABLET | ORAL | Status: DC | PRN

## 2013-05-21 MED ORDER — ASPIRIN EC 81 MG PO TBEC
81.0000 mg | DELAYED_RELEASE_TABLET | Freq: Every day | ORAL | Status: DC
  Administered 2013-05-22 – 2013-05-26 (×5): 81 mg via ORAL
  Filled 2013-05-21 (×5): qty 1

## 2013-05-21 MED ORDER — AMLODIPINE BESYLATE 5 MG PO TABS
5.0000 mg | ORAL_TABLET | Freq: Every morning | ORAL | Status: DC
  Administered 2013-05-22: 5 mg via ORAL
  Filled 2013-05-21: qty 1

## 2013-05-21 MED ORDER — INSULIN ASPART 100 UNIT/ML ~~LOC~~ SOLN: 0.0000 [IU] | Freq: Three times a day (TID) | SUBCUTANEOUS | Status: DC

## 2013-05-21 MED ORDER — ONDANSETRON HCL 4 MG/2ML IJ SOLN: 4.0000 mg | Freq: Four times a day (QID) | INTRAMUSCULAR | Status: DC | PRN

## 2013-05-21 MED ORDER — SODIUM CHLORIDE 0.9 % IV SOLN: INTRAVENOUS | Status: DC

## 2013-05-21 MED ORDER — ONDANSETRON HCL 4 MG PO TABS: 4.0000 mg | ORAL_TABLET | Freq: Four times a day (QID) | ORAL | Status: DC | PRN

## 2013-05-21 MED ORDER — LORATADINE 10 MG PO TABS
10.0000 mg | ORAL_TABLET | Freq: Every day | ORAL | Status: DC
  Administered 2013-05-22 – 2013-05-26 (×5): 10 mg via ORAL
  Filled 2013-05-21 (×5): qty 1

## 2013-05-21 MED ORDER — INSULIN ASPART 100 UNIT/ML ~~LOC~~ SOLN: 0.0000 [IU] | Freq: Every day | SUBCUTANEOUS | Status: DC

## 2013-05-21 MED ORDER — ACETAMINOPHEN 650 MG RE SUPP
650.0000 mg | Freq: Four times a day (QID) | RECTAL | Status: DC | PRN
Start: 2013-05-21 — End: 2013-05-26

## 2013-05-21 MED ORDER — VANCOMYCIN HCL IN DEXTROSE 1-5 GM/200ML-% IV SOLN
1000.0000 mg | Freq: Two times a day (BID) | INTRAVENOUS | Status: DC
  Administered 2013-05-22: 1000 mg via INTRAVENOUS
  Filled 2013-05-21 (×3): qty 200

## 2013-05-21 MED ORDER — SODIUM CHLORIDE 0.9 % IV SOLN
INTRAVENOUS | Status: DC
  Administered 2013-05-21: 23:00:00 via INTRAVENOUS

## 2013-05-21 MED ORDER — SODIUM CHLORIDE 0.9 % IJ SOLN
3.0000 mL | Freq: Two times a day (BID) | INTRAMUSCULAR | Status: DC
  Administered 2013-05-23 – 2013-05-24 (×2): 3 mL via INTRAVENOUS

## 2013-05-21 MED ORDER — LISINOPRIL 20 MG PO TABS
20.0000 mg | ORAL_TABLET | Freq: Every morning | ORAL | Status: DC
  Administered 2013-05-22: 20 mg via ORAL
  Filled 2013-05-21: qty 1

## 2013-05-21 MED ORDER — EZETIMIBE 10 MG PO TABS
10.0000 mg | ORAL_TABLET | Freq: Every morning | ORAL | Status: DC
  Administered 2013-05-22 – 2013-05-26 (×5): 10 mg via ORAL
  Filled 2013-05-21 (×5): qty 1

## 2013-05-21 MED ORDER — VANCOMYCIN HCL 10 G IV SOLR
1500.0000 mg | Freq: Once | INTRAVENOUS | Status: AC
  Administered 2013-05-21: 1500 mg via INTRAVENOUS
  Filled 2013-05-21: qty 1500

## 2013-05-21 MED ORDER — ENOXAPARIN SODIUM 40 MG/0.4ML ~~LOC~~ SOLN
40.0000 mg | SUBCUTANEOUS | Status: DC
  Administered 2013-05-22 – 2013-05-26 (×5): 40 mg via SUBCUTANEOUS
  Filled 2013-05-21 (×5): qty 0.4

## 2013-05-21 NOTE — ED Notes (Signed)
Sherry, pts significant other would like updates. [phone]

## 2013-05-21 NOTE — ED Notes (Signed)
The pt has had elevated temps intermittently for 2 months.  He has been on antibiotics and as soon as the antibiotics are finished the temp returns.  Blood cultures were drawn at his doctors office and he was called and told to come in for treatment for [facility]

## 2013-05-21 NOTE — Progress Notes (Signed)
ANTIBIOTIC CONSULT NOTE   Pharmacy Consult for vancomycin Indication: bacteremia  No Known Allergies  Patient Measurements: Height: 5\' 7"  (170.2 cm) Weight: 173 lb (78.472 kg) IBW/kg (Calculated) : 66.1   Vital Signs: Temp: 100.1 F (37.8 C) (04/12 2110) Temp src: Oral (04/12 2110) BP: 127/81 mmHg (04/12 2110) Pulse Rate: 93 (04/12 2110) Intake/Output from previous day:   Intake/Output from this shift:    Labs:  Recent Labs  05/21/13 1856  WBC 9.2  HGB 14.2  PLT 222  CREATININE 0.94   Estimated Creatinine Clearance: 71.3 ml/min (by C-G formula based on Cr of 0.94). No results found for this basename: VANCOTROUGH, VANCOPEAK, VANCORANDOM, GENTTROUGH, GENTPEAK, GENTRANDOM, TOBRATROUGH, TOBRAPEAK, TOBRARND, AMIKACINPEAK, AMIKACINTROU, AMIKACIN,  in the last 72 hours   Microbiology: No results found for this or any previous visit (from the past 720 hour(s)).  Medical History: Past Medical History  Diagnosis Date  . Diabetes mellitus without complication   . Hypertension   . Heart murmur   . Arthritis     Medications:   (Not in a hospital admission)  Assessment: 68 year old with positive blood cultures from outpatient clinic.  Goal of Therapy:  Vancomycin trough level 15-20 mcg/ml  Plan:  Vancomycin 1500 mg IV x1 followed by vanc 1 g IV q12h VT as needed F/u blood cx  Hughes Better, PharmD, BCPS Clinical Pharmacist Pager: [phone] 05/21/2013 10:32 PM

## 2013-05-21 NOTE — ED Notes (Signed)
Received call from Dr Micheal Likens pt blood culture results: Resistant to Penicillin, Sensitive to Vancomycin, Levoquin, Clindamycin.

## 2013-05-21 NOTE — H&P (Signed)
[facility] History and Physical  Patient: Jonathan Blackburn  BSJ:[id number]  DOB: 1945/07/04  DOS: the patient was seen and examined on 05/21/2013 PCP: Fae Pippin  Chief Complaint: Fever  HPI: Jonathan Blackburn is a 68 y.o. male with Past medical history of diabetes, hypertension, aortic murmur, lumbar fusion, thoracic spine rod surgery. The patient presented with complain of fever that has been ongoing since January end 2015. He mentions the fever primarily occurs in the night around midnight, initially he was having generalized weakness tired malaise and night sweats. Disease PCP since his fever was not subsiding who placed him on unknown antibiotic once a day for 10 days. As soon as he completed the course of antibiotic he started having fever again, he had a chest x-ray 2 weeks ago which was unremarkable, he went to see his PCP who did blood culture, since one of the first blood culture was positive the patient had another repeat culture 2 days later, 2 days later the first culture came back positive for MRSA, as well as the second culture was abnormal therefore the patient was sent here for further workup. Patient is already on levofloxacin for more than one week. Subjectively patient denies any complaint of headaches, dizziness, nausea, vomiting, blurring of vision, neck stiffness, chest pain, cough with expectoration, diarrhea, abdominal pain, burning urination, active bleeding. Patient also denies any lymphadenopathy. He has lost 20 pounds in the last 1 month. He denies any prior significant travel history. He complains of chronic excess urination. He also complains of chronic low back pain.  The patient is coming from home. And at her baseline Independent for most of his  ADL.  Review of Systems: as mentioned in the history of present illness.  A Comprehensive review of the other systems is negative.  Past Medical History  Diagnosis Date  . Diabetes mellitus without  complication   . Hypertension   . Heart murmur   . Arthritis    Past Surgical History  Procedure Laterality Date  . Back surgery    . Shoulder arthroscopy      rotator cuff  . Posterior lumbar fusion 4 level N/A 11/16/2012    Procedure: Thoracic seven to thoracic eleven posterior lateral thoracic arthrodesis with instrumentation and bone graft ;  Surgeon: Ophelia Charter, MD;  Location: [place] [facility];  Service: Neurosurgery;  Laterality: N/A;  Thoracic seven to thoracic eleven posterior lateral thoracic arthrodesis with instrumentation and bone graft   Social History:  reports that he has never smoked. He does not have any smokeless tobacco history on file. He reports that he drinks alcohol. He reports that he does not use illicit drugs.  No Known Allergies  No family history on file.  Prior to Admission medications   Medication Sig Start Date End Date Taking? Authorizing Provider  acetaminophen (TYLENOL) 650 MG CR tablet Take 1,300 mg by mouth 2 (two) times daily as needed for pain or fever.   Yes Historical Provider, MD  amLODipine (NORVASC) 5 MG tablet Take 5 mg by mouth every morning.    Yes Historical Provider, MD  aspirin EC 81 MG tablet Take 81 mg by mouth daily.   Yes Historical Provider, MD  ezetimibe (ZETIA) 10 MG tablet Take 10 mg by mouth every morning.   Yes Historical Provider, MD  glipiZIDE (GLUCOTROL XL) 5 MG 24 hr tablet Take 5 mg by mouth daily with breakfast.   Yes Historical Provider, MD  levofloxacin (LEVAQUIN) 500 MG tablet Take 1 tablet by  mouth daily. 04/20/13  Yes Historical Provider, MD  lisinopril (PRINIVIL,ZESTRIL) 20 MG tablet Take 20 mg by mouth every morning.    Yes Historical Provider, MD  loratadine (CLARITIN) 10 MG tablet Take 10 mg by mouth daily.   Yes Historical Provider, MD    Physical Exam: Filed Vitals:   05/21/13 1851 05/21/13 2110  BP: 141/80 127/81  Pulse: 95 93  Temp: 98.9 F (37.2 C) 100.1 F (37.8 C)  TempSrc:  Oral  Resp: 18    Height: _0  (1.702 m)   Weight: 78.472 kg (173 lb)   SpO2: 95% 97%    General: Alert, Awake and Oriented to Time, Place and Person. Appear in mild distress Eyes: PERRL ENT: Oral Mucosa clear moist. Neck: no JVD Cardiovascular: S1 and S2 Present, no Murmur, Peripheral Pulses Present Respiratory: Bilateral Air entry equal and Decreased, Clear to Auscultation,  no Crackles,no wheezes Abdomen: Bowel Sound Present, Soft and Non tender Skin: no Rash Extremities: no Pedal edema, no calf tenderness, right knee warm, and red; no swelling no tenderness Neurologic: Grossly Unremarkable.  Labs on Admission:  CBC:  Recent Labs Lab 05/21/13 1856  WBC 9.2  NEUTROABS 7.5  HGB 14.2  HCT 41.8  MCV 82.1  PLT 222    CMP     Component Value Date/Time   NA 141 05/21/2013 1856   K 4.0 05/21/2013 1856   CL 102 05/21/2013 1856   CO2 21 05/21/2013 1856   GLUCOSE 152* 05/21/2013 1856   BUN 28* 05/21/2013 1856   CREATININE 0.94 05/21/2013 1856   CALCIUM 9.6 05/21/2013 1856   PROT 7.3 05/21/2013 1856   ALBUMIN 3.6 05/21/2013 1856   AST 23 05/21/2013 1856   ALT 21 05/21/2013 1856   ALKPHOS 73 05/21/2013 1856   BILITOT 0.5 05/21/2013 1856   GFRNONAA 85* 05/21/2013 1856   GFRAA >90 05/21/2013 1856    No results found for this basename: LIPASE, AMYLASE,  in the last 168 hours No results found for this basename: AMMONIA,  in the last 168 hours  No results found for this basename: CKTOTAL, CKMB, CKMBINDEX, TROPONINI,  in the last 168 hours BNP (last 3 results) No results found for this basename: PROBNP,  in the last 8760 hours  Radiological Exams on Admission: No results found.   Assessment/Plan Principal Problem:   MRSA bacteremia Active Problems:   FUO (fever of unknown origin)   H/O spinal fusion   H/O Spinal surgery   1. MRSA bacteremia  the patient is presenting with MRSA bacteremia sensitive to vancomycin. He does have history of surgery on his spine in October and has right knee and  redness and warmth. Other than that he does not have any acute complaints. Which is long-standing fever he could also have infected endocarditis involving his valve since he appears to have systolic murmur which is better heard in mitral area. I will get an echocardiogram in the morning, blood cultures are already done patient will be started on IV vancomycin, ESR CRP chest x-ray and x-ray of spine. If patient's knee becomes symptomatic or has effusion or swelling he may require workup related to the knee. He would also require infectious disease consultation for further workup and possible TEE.  2. chronic back pain Stable continue Vicodin  3. Diabetes Placing the patient on sliding scale hold oral hypoglycemic medication  4. Hypertension Continue amlodipine and lisinopril  DVT Prophylaxis: subcutaneous Heparin Nutrition:  Cardiac diet diabetic  Code Status:  Full  Disposition: Admitted  to inpatient in med-surge unit.  Author: Berle Mull, MD [facility] Pager: [phone] 05/21/2013, 11:38 PM    If 7PM-7AM, please contact night-coverage [email or web address] Password [email or web address]

## 2013-05-21 NOTE — ED Notes (Signed)
Dr. Patel at bedside 

## 2013-05-21 NOTE — ED Provider Notes (Signed)
CSN: [id number]     Arrival date & time 05/21/13  1835 History   First MD Initiated Contact with Patient 05/21/13 2122     Chief Complaint  Patient presents with  . pos blood cultures    HPI Pt has been having trouble with intermittent fever for 2 months.  Pt sees Dr Chrystine Oiler in [place].  He had blood cultures drawn in the office this week.   He had 2 sets of cultures because of an abnormal finding with the first set.  He was called today and told to come to the ED because of a positive blood culture.  He has been having trouble with persistent pain in his back from prior surgery but it is mild.  Last time he had spinal surgery was back in October. He did not start having fevers until the last few months. He has had a cough.  No trouble with urination.  He last had a fever of 100.1 today.  He has been having night sweats. Patient has been seeing his doctor. He's had blood tests and x-rays. He has been on 2 sets of oral antibiotics. Previously it was felt that his infection was related to a prostate infection.  Patient does have a history of a heart murmur.  His doctor called ED here informing us that his blood tests showed MRSA sensitive to vancomycin and Levaquin and clindamycin. Past Medical History  Diagnosis Date  . Diabetes mellitus without complication   . Hypertension   . Heart murmur   . Arthritis    Past Surgical History  Procedure Laterality Date  . Back surgery    . Shoulder arthroscopy      rotator cuff  . Posterior lumbar fusion 4 level N/A 11/16/2012    Procedure: Thoracic seven to thoracic eleven posterior lateral thoracic arthrodesis with instrumentation and bone graft ;  Surgeon: Ophelia Charter, MD;  Location: [place] [facility];  Service: Neurosurgery;  Laterality: N/A;  Thoracic seven to thoracic eleven posterior lateral thoracic arthrodesis with instrumentation and bone graft   No family history on file. History  Substance Use Topics  . Smoking status: Never Smoker   .  Smokeless tobacco: Not on file  . Alcohol Use: Yes     Comment: weekly    Review of Systems  Constitutional: Positive for fever.  Respiratory: Negative for cough.   Gastrointestinal: Negative for abdominal pain.  All other systems reviewed and are negative.     Allergies  Review of patient's allergies indicates no known allergies.  Home Medications   Current Outpatient Rx  Name  Route  Sig  Dispense  Refill  . acetaminophen (TYLENOL) 650 MG CR tablet   Oral   Take 1,300 mg by mouth 2 (two) times daily as needed for pain or fever.         Marland Kitchen amLODipine (NORVASC) 5 MG tablet   Oral   Take 5 mg by mouth every morning.          Marland Kitchen aspirin EC 81 MG tablet   Oral   Take 81 mg by mouth daily.         Marland Kitchen ezetimibe (ZETIA) 10 MG tablet   Oral   Take 10 mg by mouth every morning.         Marland Kitchen glipiZIDE (GLUCOTROL XL) 5 MG 24 hr tablet   Oral   Take 5 mg by mouth daily with breakfast.         . levofloxacin (LEVAQUIN) 500  MG tablet   Oral   Take 1 tablet by mouth daily.         Marland Kitchen lisinopril (PRINIVIL,ZESTRIL) 20 MG tablet   Oral   Take 20 mg by mouth every morning.          . loratadine (CLARITIN) 10 MG tablet   Oral   Take 10 mg by mouth daily.          BP 127/81  Pulse 93  Temp(Src) 100.1 F (37.8 C) (Oral)  Resp 18  Ht 5\' 7"  (1.702 m)  Wt 173 lb (78.472 kg)  BMI 27.09 kg/m2  SpO2 97% Physical Exam  Nursing note and vitals reviewed. Constitutional: He appears well-developed and well-nourished. No distress.  HENT:  Head: Normocephalic and atraumatic.  Right Ear: External ear normal.  Left Ear: External ear normal.  Eyes: Conjunctivae are normal. Right eye exhibits no discharge. Left eye exhibits no discharge. No scleral icterus.  Neck: Neck supple. No tracheal deviation present.  Cardiovascular: Normal rate, regular rhythm and intact distal pulses.   No murmur heard. Pulmonary/Chest: Effort normal and breath sounds normal. No stridor. No  respiratory distress. He has no wheezes. He has no rales.  Abdominal: Soft. Bowel sounds are normal. He exhibits no distension. There is no tenderness. There is no rebound and no guarding.  Musculoskeletal: He exhibits no edema and no tenderness.  Neurological: He is alert. He has normal strength. No cranial nerve deficit (no facial droop, extraocular movements intact, no slurred speech) or sensory deficit. He exhibits normal muscle tone. He displays no seizure activity. Coordination normal.  Skin: Skin is warm and dry. No rash noted.  Psychiatric: He has a normal mood and affect.    ED Course  Procedures (including critical care time) Labs Review Labs Reviewed  CBC WITH DIFFERENTIAL - Abnormal; Notable for the following:    Neutrophils Relative % 82 (*)    Lymphocytes Relative 8 (*)    All other components within normal limits  COMPREHENSIVE METABOLIC PANEL - Abnormal; Notable for the following:    Glucose, Bld 152 (*)    BUN 28 (*)    GFR calc non Af Amer 85 (*)    All other components within normal limits  CULTURE, BLOOD (ROUTINE X 2)  CULTURE, BLOOD (ROUTINE X 2)  URINALYSIS, ROUTINE W REFLEX MICROSCOPIC   Imaging Review No results found.   EKG Interpretation None      MDM   Final diagnoses:  None    Patient has had intermittent fevers over the last couple months. Patient had outpatient blood cultures that were positive.  Patient appears in no distress. I do not hear an appreciable murmur on exam.  Repeat blood cultures ordered.  Pt will be stared on Vancomycin IV.  Check CXR and UA.  May need echo further testing.    Kathalene Frames, MD 05/21/13 2207

## 2013-05-22 ENCOUNTER — Inpatient Hospital Stay (HOSPITAL_COMMUNITY): Payer: Medicare Other

## 2013-05-22 ENCOUNTER — Encounter (HOSPITAL_COMMUNITY): Payer: Self-pay | Admitting: *Deleted

## 2013-05-22 DIAGNOSIS — I339 Acute and subacute endocarditis, unspecified: Secondary | ICD-10-CM

## 2013-05-22 DIAGNOSIS — Z9889 Other specified postprocedural states: Secondary | ICD-10-CM

## 2013-05-22 DIAGNOSIS — I39 Endocarditis and heart valve disorders in diseases classified elsewhere: Secondary | ICD-10-CM

## 2013-05-22 DIAGNOSIS — R509 Fever, unspecified: Secondary | ICD-10-CM | POA: Diagnosis present

## 2013-05-22 DIAGNOSIS — E119 Type 2 diabetes mellitus without complications: Secondary | ICD-10-CM

## 2013-05-22 DIAGNOSIS — I10 Essential (primary) hypertension: Secondary | ICD-10-CM

## 2013-05-22 DIAGNOSIS — R7881 Bacteremia: Secondary | ICD-10-CM

## 2013-05-22 DIAGNOSIS — B954 Other streptococcus as the cause of diseases classified elsewhere: Secondary | ICD-10-CM

## 2013-05-22 DIAGNOSIS — Z981 Arthrodesis status: Secondary | ICD-10-CM

## 2013-05-22 LAB — COMPREHENSIVE METABOLIC PANEL
ALBUMIN: 3 g/dL — AB (ref 3.5–5.2)
ALT: 16 U/L (ref 0–53)
AST: 15 U/L (ref 0–37)
Alkaline Phosphatase: 60 U/L (ref 39–117)
BUN: 19 mg/dL (ref 6–23)
CALCIUM: 8.9 mg/dL (ref 8.4–10.5)
CO2: 20 mEq/L (ref 19–32)
CREATININE: 0.75 mg/dL (ref 0.50–1.35)
Chloride: 102 mEq/L (ref 96–112)
GFR calc Af Amer: >90 mL/min (ref >90)
Glucose, Bld: 130 mg/dL — ABNORMAL HIGH (ref 70–99)
Potassium: 3.8 mEq/L (ref 3.7–5.3)
Sodium: 138 mEq/L (ref 137–147)
Total Bilirubin: 0.6 mg/dL (ref 0.3–1.2)
Total Protein: 6.2 g/dL (ref 6.0–8.3)

## 2013-05-22 LAB — SEDIMENTATION RATE: SED RATE: 24 mm/h — AB (ref 0–16)

## 2013-05-22 LAB — CBC
HCT: 37.3 % — ABNORMAL LOW (ref 39.0–52.0)
Hemoglobin: 12.6 g/dL — ABNORMAL LOW (ref 13.0–17.0)
MCH: 27.2 pg (ref 26.0–34.0)
MCHC: 33.8 g/dL (ref 30.0–36.0)
MCV: 80.4 fL (ref 78.0–100.0)
Platelets: 187 10*3/uL (ref 150–400)
RBC: 4.64 MIL/uL (ref 4.22–5.81)
RDW: 14.9 % (ref 11.5–15.5)
WBC: 7.1 10*3/uL (ref 4.0–10.5)

## 2013-05-22 LAB — GLUCOSE, CAPILLARY
GLUCOSE-CAPILLARY: 118 mg/dL — AB (ref 70–99)
GLUCOSE-CAPILLARY: 120 mg/dL — AB (ref 70–99)
GLUCOSE-CAPILLARY: 171 mg/dL — AB (ref 70–99)
Glucose-Capillary: 137 mg/dL — ABNORMAL HIGH (ref 70–99)

## 2013-05-22 LAB — PROTIME-INR
INR: 1.15 (ref 0.00–1.49)
PROTHROMBIN TIME: 14.5 s (ref 11.6–15.2)

## 2013-05-22 LAB — C-REACTIVE PROTEIN: CRP: 4.7 mg/dL — ABNORMAL HIGH (ref <0.60)

## 2013-05-22 MED ORDER — SODIUM CHLORIDE 0.9 % IV SOLN
INTRAVENOUS | Status: DC
  Administered 2013-05-22 – 2013-05-26 (×4): via INTRAVENOUS

## 2013-05-22 MED ORDER — DEXTROSE-NACL 5-0.45 % IV SOLN
INTRAVENOUS | Status: DC
  Administered 2013-05-23: 04:00:00 via INTRAVENOUS

## 2013-05-22 MED ORDER — DIAZEPAM 5 MG PO TABS: 10.0000 mg | ORAL_TABLET | ORAL | Status: AC

## 2013-05-22 MED ORDER — LEVOFLOXACIN 500 MG PO TABS
500.0000 mg | ORAL_TABLET | ORAL | Status: DC
  Filled 2013-05-22: qty 1

## 2013-05-22 MED ORDER — INSULIN ASPART 100 UNIT/ML ~~LOC~~ SOLN: 0.0000 [IU] | Freq: Every day | SUBCUTANEOUS | Status: DC

## 2013-05-22 MED ORDER — INSULIN ASPART 100 UNIT/ML ~~LOC~~ SOLN
0.0000 [IU] | Freq: Three times a day (TID) | SUBCUTANEOUS | Status: DC
  Administered 2013-05-22 – 2013-05-23 (×2): 2 [IU] via SUBCUTANEOUS
  Administered 2013-05-23: 1 [IU] via SUBCUTANEOUS
  Administered 2013-05-23 – 2013-05-24 (×2): 2 [IU] via SUBCUTANEOUS
  Administered 2013-05-24 – 2013-05-25 (×3): 1 [IU] via SUBCUTANEOUS
  Administered 2013-05-25 – 2013-05-26 (×2): 2 [IU] via SUBCUTANEOUS

## 2013-05-22 MED ORDER — DIPHENHYDRAMINE HCL 25 MG PO CAPS: 25.0000 mg | ORAL_CAPSULE | ORAL | Status: AC

## 2013-05-22 MED ORDER — INSULIN ASPART 100 UNIT/ML ~~LOC~~ SOLN: 0.0000 [IU] | Freq: Three times a day (TID) | SUBCUTANEOUS | Status: DC

## 2013-05-22 MED ORDER — IOHEXOL 300 MG/ML  SOLN
25.0000 mL | INTRAMUSCULAR | Status: AC
  Administered 2013-05-22 (×2): 25 mL via ORAL

## 2013-05-22 MED ORDER — IOHEXOL 300 MG/ML  SOLN
100.0000 mL | Freq: Once | INTRAMUSCULAR | Status: AC | PRN
  Administered 2013-05-22: 100 mL via INTRAVENOUS

## 2013-05-22 MED ORDER — DEXTROSE 5 % IV SOLN
2.0000 g | INTRAVENOUS | Status: DC
  Filled 2013-05-22: qty 2

## 2013-05-22 NOTE — Consult Note (Signed)
[facility] for Infectious Disease    Date of Admission:  05/21/2013  Date of Consult:  05/22/2013  Reason for Consult: Viridans bacteremia and fevers Referring Physician: Dr. Coralyn Pear   HPI: Jonathan Blackburn is an 68 y.o. male with medical history of diabetes, hypertension, aortic murmur, lumbar fusion, thoracic spine rod surgery.  The patient presented with complain of fever that has been ongoing since January end 2015. He mentions the fever primarily occurs in the night around midnight, initially he was having generalized weakness tired malaise and night sweats. He also claims to have lost approximately 20 pounds during that time.  History various antibiotics including doxycycline for possible sinusitis and levofloxacin for possible prostatitis. His chest x-rays and urine cultures were negative.  He had a blood culture drawn from one site on 52/09/221 would certainly old and a viridans group streptococcal species. Susceptibilities and report are pictured below in the photograph. Of note the culture was done that only one arm and not with a simultaneous blood culture from another site to help assist in the assessment of whether this culture was a true positive or contaminant. Since then he had a second blood culture obtained on the 10th. This is felt growing organism. He was called by his physician from [facility] and asked to come to the hospital admitted to the hospitalist service for workup for bacteremia.    He had been placed on vancomycin after blood cultures were drawn on admission. Blood cultures drawn here and the second blood culture drawn on the 10th in [place] have failed to grow any organisms to date. This raises the question of whether the culture on the eighth was actually a contaminant rather than true pathogen. He's really does however have fevers that require workup and I think proceeding with a transesophageal echocardiogram may assist in the workup of  his fevers      Past Medical History  Diagnosis Date  . Diabetes mellitus without complication   . Hypertension   . Heart murmur   . Arthritis     Past Surgical History  Procedure Laterality Date  . Back surgery    . Shoulder arthroscopy      rotator cuff  . Posterior lumbar fusion 4 level N/A 11/16/2012    Procedure: Thoracic seven to thoracic eleven posterior lateral thoracic arthrodesis with instrumentation and bone graft ;  Surgeon: Ophelia Charter, MD;  Location: [facility];  Service: Neurosurgery;  Laterality: N/A;  Thoracic seven to thoracic eleven posterior lateral thoracic arthrodesis with instrumentation and bone graft  ergies:   No Known Allergies   Medications: I have reviewed patients current medications as documented in Epic Anti-infectives   Start     Dose/Rate Route Frequency Ordered Stop   05/22/13 1332  levofloxacin (LEVAQUIN) tablet 500 mg  Status:  Discontinued     500 mg Oral 60 min pre-op 05/22/13 1332 05/22/13 1459   05/22/13 1200  cefTRIAXone (ROCEPHIN) 2 g in dextrose 5 % 50 mL IVPB  Status:  Discontinued     2 g 100 mL/hr over 30 Minutes Intravenous Every 24 hours 05/22/13 1016 05/22/13 1024   05/22/13 1030  vancomycin (VANCOCIN) IVPB 1000 mg/200 mL premix  Status:  Discontinued     1,000 mg 200 mL/hr over 60 Minutes Intravenous Every 12 hours 05/21/13 2225 05/22/13 1459   05/21/13 2230  vancomycin (VANCOCIN) 1,500 mg in sodium chloride 0.9 % 500 mL IVPB     1,500 mg 250 mL/hr  over 120 Minutes Intravenous  Once 05/21/13 2224 05/22/13 0129      Social History:  reports that he has never smoked. He does not have any smokeless tobacco history on file. He reports that he drinks alcohol. He reports that he does not use illicit drugs.  No family history on file.  As in HPI and primary teams notes otherwise 12 point review of systems is negative  Blood pressure 112/62, pulse 80, temperature 98.9 F (37.2 C), temperature source Oral, resp. rate  18, height _0  (1.702 m), weight 170 lb 10.2 oz (77.4 kg), SpO2 98.00%. General: Alert and awake, oriented x3, not in any acute distress. HEENT: anicteric sclera, pupils reactive to light and accommodation, EOMI, oropharynx clear and without exudate CVS regular rate, normal r,  no murmur rubs or gallops Chest: clear to auscultation bilaterally, no wheezing, rales or rhonchi Abdomen: soft nontender, nondistended, normal bowel sounds, Extremities: no  clubbing or edema noted bilaterally Skin: no rashes Neuro: nonfocal, strength and sensation intact   Results for orders placed during the hospital encounter of 05/21/13 (from the past 48 hour(s))  CBC WITH DIFFERENTIAL     Status: Abnormal   Collection Time    05/21/13  6:56 PM      Result Value Ref Range   WBC 9.2  4.0 - 10.5 K/uL   RBC 5.09  4.22 - 5.81 MIL/uL   Hemoglobin 14.2  13.0 - 17.0 g/dL   HCT 41.8  39.0 - 52.0 %   MCV 82.1  78.0 - 100.0 fL   MCH 27.9  26.0 - 34.0 pg   MCHC 34.0  30.0 - 36.0 g/dL   RDW 14.9  11.5 - 15.5 %   Platelets 222  150 - 400 K/uL   Neutrophils Relative % 82 (*) 43 - 77 %   Neutro Abs 7.5  1.7 - 7.7 K/uL   Lymphocytes Relative 8 (*) 12 - 46 %   Lymphs Abs 0.7  0.7 - 4.0 K/uL   Monocytes Relative 9  3 - 12 %   Monocytes Absolute 0.8  0.1 - 1.0 K/uL   Eosinophils Relative 1  0 - 5 %   Eosinophils Absolute 0.1  0.0 - 0.7 K/uL   Basophils Relative 0  0 - 1 %   Basophils Absolute 0.0  0.0 - 0.1 K/uL  COMPREHENSIVE METABOLIC PANEL     Status: Abnormal   Collection Time    05/21/13  6:56 PM      Result Value Ref Range   Sodium 141  137 - 147 mEq/L   Potassium 4.0  3.7 - 5.3 mEq/L   Chloride 102  96 - 112 mEq/L   CO2 21  19 - 32 mEq/L   Glucose, Bld 152 (*) 70 - 99 mg/dL   BUN 28 (*) 6 - 23 mg/dL   Creatinine, Ser 0.94  0.50 - 1.35 mg/dL   Calcium 9.6  8.4 - 10.5 mg/dL   Total Protein 7.3  6.0 - 8.3 g/dL   Albumin 3.6  3.5 - 5.2 g/dL   AST 23  0 - 37 U/L   ALT 21  0 - 53 U/L   Alkaline  Phosphatase 73  39 - 117 U/L   Total Bilirubin 0.5  0.3 - 1.2 mg/dL   GFR calc non Af Amer 85 (*) >90 mL/min   GFR calc Af Amer >90  >90 mL/min   Comment: (NOTE)     The eGFR has been calculated using  the CKD EPI equation.     This calculation has not been validated in all clinical situations.     eGFR's persistently <90 mL/min signify possible Chronic Kidney     Disease.  SEDIMENTATION RATE     Status: Abnormal   Collection Time    05/21/13 11:04 PM      Result Value Ref Range   Sed Rate 24 (*) 0 - 16 mm/hr  C-REACTIVE PROTEIN     Status: Abnormal   Collection Time    05/21/13 11:04 PM      Result Value Ref Range   CRP 4.7 (*) <0.60 mg/dL   Comment: Performed at [facility]     Status: None   Collection Time    05/21/13 11:35 PM      Result Value Ref Range   Color, Urine YELLOW  YELLOW   APPearance CLEAR  CLEAR   Specific Gravity, Urine 1.024  1.005 - 1.030   pH 5.0  5.0 - 8.0   Glucose, UA NEGATIVE  NEGATIVE mg/dL   Hgb urine dipstick NEGATIVE  NEGATIVE   Bilirubin Urine NEGATIVE  NEGATIVE   Ketones, ur NEGATIVE  NEGATIVE mg/dL   Protein, ur NEGATIVE  NEGATIVE mg/dL   Urobilinogen, UA 0.2  0.0 - 1.0 mg/dL   Nitrite NEGATIVE  NEGATIVE   Leukocytes, UA NEGATIVE  NEGATIVE   Comment: MICROSCOPIC NOT DONE ON URINES WITH NEGATIVE PROTEIN, BLOOD, LEUKOCYTES, NITRITE, OR GLUCOSE <1000 mg/dL.  COMPREHENSIVE METABOLIC PANEL     Status: Abnormal   Collection Time    05/22/13  6:02 AM      Result Value Ref Range   Sodium 138  137 - 147 mEq/L   Potassium 3.8  3.7 - 5.3 mEq/L   Chloride 102  96 - 112 mEq/L   CO2 20  19 - 32 mEq/L   Glucose, Bld 130 (*) 70 - 99 mg/dL   BUN 19  6 - 23 mg/dL   Creatinine, Ser 0.75  0.50 - 1.35 mg/dL   Calcium 8.9  8.4 - 10.5 mg/dL   Total Protein 6.2  6.0 - 8.3 g/dL   Albumin 3.0 (*) 3.5 - 5.2 g/dL   AST 15  0 - 37 U/L   ALT 16  0 - 53 U/L   Alkaline Phosphatase 60  39 - 117 U/L   Total  Bilirubin 0.6  0.3 - 1.2 mg/dL   GFR calc non Af Amer >90  >90 mL/min   GFR calc Af Amer >90  >90 mL/min   Comment: (NOTE)     The eGFR has been calculated using the CKD EPI equation.     This calculation has not been validated in all clinical situations.     eGFR's persistently <90 mL/min signify possible Chronic Kidney     Disease.  CBC     Status: Abnormal   Collection Time    05/22/13  6:02 AM      Result Value Ref Range   WBC 7.1  4.0 - 10.5 K/uL   RBC 4.64  4.22 - 5.81 MIL/uL   Hemoglobin 12.6 (*) 13.0 - 17.0 g/dL   HCT 37.3 (*) 39.0 - 52.0 %   MCV 80.4  78.0 - 100.0 fL   MCH 27.2  26.0 - 34.0 pg   MCHC 33.8  30.0 - 36.0 g/dL   RDW 14.9  11.5 - 15.5 %   Platelets 187  150 - 400 K/uL  PROTIME-INR     Status: None   Collection Time    05/22/13  6:02 AM      Result Value Ref Range   Prothrombin Time 14.5  11.6 - 15.2 seconds   INR 1.15  0.00 - 1.49  GLUCOSE, CAPILLARY     Status: Abnormal   Collection Time    05/22/13  7:32 AM      Result Value Ref Range   Glucose-Capillary 118 (*) 70 - 99 mg/dL  GLUCOSE, CAPILLARY     Status: Abnormal   Collection Time    05/22/13 11:00 AM      Result Value Ref Range   Glucose-Capillary 120 (*) 70 - 99 mg/dL  GLUCOSE, CAPILLARY     Status: Abnormal   Collection Time    05/22/13  4:01 PM      Result Value Ref Range   Glucose-Capillary 171 (*) 70 - 99 mg/dL   No results found for this basename: sdes, specrequest, cult, reptstatus   Dg Chest 2 View  05/22/2013   CLINICAL DATA:  Cough, history of back fracture.  Positive MRSA.  EXAM: CHEST  2 VIEW  COMPARISON:  DG CHEST 2V dated 04/21/2013  FINDINGS: Cardiomediastinal silhouette is unremarkable for this low inspiratory examination with crowded vasculature markings. Minimal bibasilar atelectasis. The lungs are otherwise clear without pleural effusions or focal consolidations. Trachea projects midline and there is no pneumothorax. Included soft tissue planes and osseous structures are  non-suspicious. Lower thoracic posterior instrumentation.  IMPRESSION: Minimal bibasilar atelectasis in this low inspiratory portable examination   Electronically Signed   By: Elon Alas   On: 05/22/2013 00:37   Dg Thoracic Spine W/swimmers  05/22/2013   CLINICAL DATA:  History of back fracture, MRSA.  EXAM: THORACIC SPINE - 2 VIEW + SWIMMERS  COMPARISON:  DG THORACIC SPINE 2V dated 03/07/2013; DG CHEST 2V dated 04/21/2013  FINDINGS: Posterior instrumentation and apparent bone graft material transfix single lower thoracic level moderate compression fracture. No acute fracture deformity or dislocation. Intervertebral disc heights generally preserved. No destructive bony lesions. Included prevertebral and paraspinal soft tissue planes are nonsuspicious.  IMPRESSION: Stable appearance of thoracic spine without acute fracture deformity or malalignment.   Electronically Signed   By: Elon Alas   On: 05/22/2013 00:42   Dg Lumbar Spine 2-3 Views  05/22/2013   CLINICAL DATA:  Back fracture, MRSA.  EXAM: LUMBAR SPINE - 2-3 VIEW  COMPARISON:  DG ABDOMEN 1V dated 04/26/2013  FINDINGS: Six non rib-bearing lumbar type vertebral bodies. Lumbar vertebral bodies appear intact and aligned with maintenance of lumbar lordosis. Mild-to-moderate degenerative disc disease at all levels. Moderate lower lumbar facet arthropathy. Sacroiliac joints are symmetric.  Lower thoracic posterior instrumentation. Included prevertebral and paraspinal soft tissue planes are nonsuspicious.  IMPRESSION: Degenerative change of the lumbar spine without acute fracture deformity or malalignment.   Electronically Signed   By: Elon Alas   On: 05/22/2013 00:48     No results found for this or any previous visit (from the past 720 hour(s)).   Impression/Recommendation  Principal Problem:   MRSA bacteremia Active Problems:   FUO (fever of unknown origin)   H/O spinal fusion   H/O Spinal surgery   Jonathan Blackburn is a 68  y.o. male with  FUO and 1/1 positive blood cx for VIridans Strep from 05/17/13 without a simultaneous culture having been performed Who has had FUO and weight loss since January of 2015  #1 1 blood culture with viridans strep:  I think this most likely is a contaminant given that is not growing on the cultures from the 10th nor on the admission blood cultures today. Therefore I will stop his antibiotics. I do thank to getting a transesophageal echocardiogram is a reasonable test given his history of cardiac murmur a fevers weight loss. I do not think that we can treat for endocarditis or even bacteremia with his isolated culture done on the eighth. Viridans group strep is a common contaminant in blood cultures. He has not grown it from the blood culture drawn on the 10th nor of the 2 sites cultured on his admission to [facility] on the 12.  #2 fever of unknown origin elevated a CT of the chest abdomen and pelvis as well as initiate labs for infectious disease as well as connective tissue diseases and malignancy with his morning blood draw. I spent greater than 45 minutes with the patient including greater than 50% of time in face to face counsel of the patient and in coordination of their care.   05/22/2013, 6:01 PM   Thank you so much for this interesting consult  [facility] [place] [phone] (pager) [phone] (office) 05/22/2013, 6:01 PM  [place] 05/22/2013, 6:01 PM

## 2013-05-22 NOTE — Progress Notes (Signed)
[facility] PROGRESS NOTE  Jonathan Blackburn VHQ:[id number] DOB: 1945/09/09 DOA: 05/21/2013 PCP: Fae Pippin  Assessment/Plan: 1. Suspected Endocarditis.  -Patient presenting with a 2 to 56-month history of generalized weakness, malaise, fevers, chills, night sweats, 20 pound weight loss.  -Blood cultures obtained at his primary care physician's office on 05/17/13 growing Viridans Streptococcus group.  -Blood culture report faxed to 2 [facility], it appears organism is susceptible to vancomycin, levofloxacin clindamycin, with intermediate susceptibility to erythromycin ceftriaxone and cefepime, resistant to penicillin and cefotaxime.  -Blood culture report left in paper chart.  -For now will continue with Vancomycin. Hold off on Ceftriaxone given intermediate susceptibility until seen by ID.  -Cardiology has been consulted for TEE -Repeat Blood Cultures have been drawn on admission  -Had been on Levaquin therapy for 3 weeks for presumed Protititis.  -Await further recs from ID 2.  Type 2 diabetes mellitus - Patient currently n.p.o., oral hypoglycemic meds held for now -Continue on Accu-Cheks with sliding scale coverage 3. Hypertension -Blood pressure low this morning at 94/52 -Will hold lisinopril and amlodipine for now as I am concerned for hypotension 4. DVT prophylaxis -Subcutaneous Lovenox   Code Status: Full Code Family Communication: I spoke with patient's significant other at bedside Disposition Plan: ID and Cardiology Consult, plan for TEE   Consultants:  ID  Cardiology  Procedures:  Pending TEE  Antibiotics:  Vancomycin (started on 05/21/13)  HPI/Subjective: Patient is a pleasant 68 year old gentleman with a past medical history of type 2 diabetes mellitus, hypertension, admitted to the medicine service on 05/21/2013. He has had fevers, chills, night sweats since the end of January associated with generalized weakness, malaise, fatigue, poor tolerance to physical  exertion. He also endorses a 20 pound weight loss during this time. He was treated for prostatitis by his primary care physician with Levaquin. Blood cultures were checked, growing Streptococcus, organism susceptible to vancomycin levofloxacin clindamycin chloramphenicol with intermediate susceptibility to ceftriaxone cefepime, resistant to Cefotaxime. Case discussed with infectious disease and with cardiology.   Objective: Filed Vitals:   05/22/13 0446  BP: 94/52  Pulse: 83  Temp: 99.9 F (37.7 C)  Resp: 17    Intake/Output Summary (Last 24 hours) at 05/22/13 1013 Last data filed at 05/22/13 0600  Gross per 24 hour  Intake      0 ml  Output    500 ml  Net   -500 ml   Filed Weights   05/21/13 1851 05/22/13 0059  Weight: 78.472 kg (173 lb) 77.4 kg (170 lb 10.2 oz)    Exam:   General:  Patient is in no acute distress, nontoxic appearing awake alert and oriented  Cardiovascular: Regular rate and rhythm normal S1-S2  Respiratory: Clear to auscultation bilaterally  Abdomen: No pain, positive bowel sounds  Musculoskeletal: No edema  Skin: Did not note rashes  Data Reviewed: Basic Metabolic Panel:  Recent Labs Lab 05/21/13 1856 05/22/13 0602  NA 141 138  K 4.0 3.8  CL 102 102  CO2 21 20  GLUCOSE 152* 130*  BUN 28* 19  CREATININE 0.94 0.75  CALCIUM 9.6 8.9   Liver Function Tests:  Recent Labs Lab 05/21/13 1856 05/22/13 0602  AST 23 15  ALT 21 16  ALKPHOS 73 60  BILITOT 0.5 0.6  PROT 7.3 6.2  ALBUMIN 3.6 3.0*   No results found for this basename: LIPASE, AMYLASE,  in the last 168 hours No results found for this basename: AMMONIA,  in the last 168 hours CBC:  Recent Labs  Lab 05/21/13 1856 05/22/13 0602  WBC 9.2 7.1  NEUTROABS 7.5  --   HGB 14.2 12.6*  HCT 41.8 37.3*  MCV 82.1 80.4  PLT 222 187   Cardiac Enzymes: No results found for this basename: CKTOTAL, CKMB, CKMBINDEX, TROPONINI,  in the last 168 hours BNP (last 3 results) No results  found for this basename: PROBNP,  in the last 8760 hours CBG:  Recent Labs Lab 05/22/13 0732  GLUCAP 118*    No results found for this or any previous visit (from the past 240 hour(s)).   Studies: Dg Chest 2 View  05/22/2013   CLINICAL DATA:  Cough, history of back fracture.  Positive MRSA.  EXAM: CHEST  2 VIEW  COMPARISON:  DG CHEST 2V dated 04/21/2013  FINDINGS: Cardiomediastinal silhouette is unremarkable for this low inspiratory examination with crowded vasculature markings. Minimal bibasilar atelectasis. The lungs are otherwise clear without pleural effusions or focal consolidations. Trachea projects midline and there is no pneumothorax. Included soft tissue planes and osseous structures are non-suspicious. Lower thoracic posterior instrumentation.  IMPRESSION: Minimal bibasilar atelectasis in this low inspiratory portable examination   Electronically Signed   By: Elon Alas   On: 05/22/2013 00:37   Dg Thoracic Spine W/swimmers  05/22/2013   CLINICAL DATA:  History of back fracture, MRSA.  EXAM: THORACIC SPINE - 2 VIEW + SWIMMERS  COMPARISON:  DG THORACIC SPINE 2V dated 03/07/2013; DG CHEST 2V dated 04/21/2013  FINDINGS: Posterior instrumentation and apparent bone graft material transfix single lower thoracic level moderate compression fracture. No acute fracture deformity or dislocation. Intervertebral disc heights generally preserved. No destructive bony lesions. Included prevertebral and paraspinal soft tissue planes are nonsuspicious.  IMPRESSION: Stable appearance of thoracic spine without acute fracture deformity or malalignment.   Electronically Signed   By: Elon Alas   On: 05/22/2013 00:42   Dg Lumbar Spine 2-3 Views  05/22/2013   CLINICAL DATA:  Back fracture, MRSA.  EXAM: LUMBAR SPINE - 2-3 VIEW  COMPARISON:  DG ABDOMEN 1V dated 04/26/2013  FINDINGS: Six non rib-bearing lumbar type vertebral bodies. Lumbar vertebral bodies appear intact and aligned with maintenance of  lumbar lordosis. Mild-to-moderate degenerative disc disease at all levels. Moderate lower lumbar facet arthropathy. Sacroiliac joints are symmetric.  Lower thoracic posterior instrumentation. Included prevertebral and paraspinal soft tissue planes are nonsuspicious.  IMPRESSION: Degenerative change of the lumbar spine without acute fracture deformity or malalignment.   Electronically Signed   By: Elon Alas   On: 05/22/2013 00:48    Scheduled Meds: . amLODipine  5 mg Oral q morning - 10a  . aspirin EC  81 mg Oral Daily  . enoxaparin (LOVENOX) injection  40 mg Subcutaneous Q24H  . ezetimibe  10 mg Oral q morning - 10a  . insulin aspart  0-5 Units Subcutaneous QHS  . insulin aspart  0-9 Units Subcutaneous TID WC  . lisinopril  20 mg Oral q morning - 10a  . loratadine  10 mg Oral Daily  . sodium chloride  3 mL Intravenous Q12H  . vancomycin  1,000 mg Intravenous Q12H   Continuous Infusions: . sodium chloride 125 mL/hr at 05/21/13 2329  . sodium chloride      Principal Problem:   MRSA bacteremia Active Problems:   FUO (fever of unknown origin)   H/O spinal fusion   H/O Spinal surgery    Time spent: 35 minutes    [facility] Pager [phone]. If 7PM-7AM, please contact night-coverage at  [email or web address], password [email or web address] 05/22/2013, 10:13 AM  LOS: 1 day

## 2013-05-22 NOTE — Progress Notes (Signed)
    [facility] has been requested to perform a transesophageal echocardiogram on Jonathan Blackburn for suspected endocarditis.  After careful review of history and examination, the risks and benefits of transesophageal echocardiogram have been explained including risks of esophageal damage, perforation (1:10,000 risk), bleeding, pharyngeal hematoma as well as other potential complications associated with conscious sedation including aspiration, arrhythmia, respiratory failure and death. Alternatives to treatment were discussed, questions were answered. Patient is willing to proceed.  He was anxious about the procedure.    Cecilie Kicks, FNP-C 05/22/2013 1:34 PM   Jonathan Blackburn is an 68 y.o. male with medical history of diabetes, hypertension, aortic murmur, lumbar fusion, thoracic spine rod surgery.   The patient presented with complain of fever that has been ongoing since January end 2015. He mentions the fever primarily occurs in the night around midnight, initially he was having generalized weakness tired malaise and night sweats. He also claims to have lost approximately 20 pounds during that time.  S. Viridans + blood cx.   Plan for TEE as described above.   Candee Furbish, MD

## 2013-05-22 NOTE — Progress Notes (Signed)
Utilization Review Completed.Neoma Laming T Dowell4/13/2015

## 2013-05-23 ENCOUNTER — Encounter (HOSPITAL_COMMUNITY): Payer: Self-pay

## 2013-05-23 ENCOUNTER — Encounter (HOSPITAL_COMMUNITY)
Admission: EM | Disposition: A | Discharge status: Home Health | Payer: Self-pay | Source: Home / Self Care | Attending: Internal Medicine

## 2013-05-23 DIAGNOSIS — R7881 Bacteremia: Secondary | ICD-10-CM | POA: Diagnosis present

## 2013-05-23 HISTORY — PX: TEE WITHOUT CARDIOVERSION: SHX5443

## 2013-05-23 LAB — BASIC METABOLIC PANEL
BUN: 12 mg/dL (ref 6–23)
BUN: 12 mg/dL (ref 6–23)
CALCIUM: 9 mg/dL (ref 8.4–10.5)
CALCIUM: 9 mg/dL (ref 8.4–10.5)
CO2: 20 mEq/L (ref 19–32)
CO2: 19 mEq/L (ref 19–32)
CREATININE: 0.73 mg/dL (ref 0.50–1.35)
Chloride: 104 mEq/L (ref 96–112)
Chloride: 104 mEq/L (ref 96–112)
Creatinine, Ser: 0.73 mg/dL (ref 0.50–1.35)
GFR calc Af Amer: >90 mL/min (ref >90)
GFR calc Af Amer: >90 mL/min (ref >90)
GFR calc non Af Amer: >90 mL/min (ref >90)
GLUCOSE: 147 mg/dL — AB (ref 70–99)
GLUCOSE: 148 mg/dL — AB (ref 70–99)
Potassium: 4.2 mEq/L (ref 3.7–5.3)
Potassium: 4.4 mEq/L (ref 3.7–5.3)
Sodium: 138 mEq/L (ref 137–147)
Sodium: 139 mEq/L (ref 137–147)

## 2013-05-23 LAB — RHEUMATOID FACTOR: Rhuematoid fact SerPl-aCnc: 11 IU/mL (ref <=14)

## 2013-05-23 LAB — CBC
HCT: 38 % — ABNORMAL LOW (ref 39.0–52.0)
HEMOGLOBIN: 12.7 g/dL — AB (ref 13.0–17.0)
MCH: 27.1 pg (ref 26.0–34.0)
MCHC: 33.4 g/dL (ref 30.0–36.0)
MCV: 81.2 fL (ref 78.0–100.0)
PLATELETS: 202 10*3/uL (ref 150–400)
RBC: 4.68 MIL/uL (ref 4.22–5.81)
RDW: 15 % (ref 11.5–15.5)
WBC: 7.5 10*3/uL (ref 4.0–10.5)

## 2013-05-23 LAB — RPR

## 2013-05-23 LAB — CK: CK TOTAL: 38 U/L (ref 7–232)

## 2013-05-23 LAB — GLUCOSE, CAPILLARY
GLUCOSE-CAPILLARY: 164 mg/dL — AB (ref 70–99)
GLUCOSE-CAPILLARY: 121 mg/dL — AB (ref 70–99)
GLUCOSE-CAPILLARY: 127 mg/dL — AB (ref 70–99)
Glucose-Capillary: 164 mg/dL — ABNORMAL HIGH (ref 70–99)

## 2013-05-23 LAB — LACTATE DEHYDROGENASE: LDH: 262 U/L — ABNORMAL HIGH (ref 94–250)

## 2013-05-23 LAB — C-REACTIVE PROTEIN: CRP: 4.4 mg/dL — AB (ref <0.60)

## 2013-05-23 LAB — HEPATITIS PANEL, ACUTE
HCV Ab: NEGATIVE
HEP A IGM: NONREACTIVE
HEP B S AG: NEGATIVE
Hep B C IgM: NONREACTIVE

## 2013-05-23 LAB — HIV ANTIBODY (ROUTINE TESTING W REFLEX): HIV 1&2 Ab, 4th Generation: NONREACTIVE

## 2013-05-23 LAB — FERRITIN: FERRITIN: 410 ng/mL — AB (ref 22–322)

## 2013-05-23 SURGERY — ECHOCARDIOGRAM, TRANSESOPHAGEAL
Anesthesia: Moderate Sedation

## 2013-05-23 MED ORDER — SODIUM CHLORIDE 0.9 % IV SOLN
INTRAVENOUS | Status: DC
  Administered 2013-05-23: 10:00:00 via INTRAVENOUS

## 2013-05-23 MED ORDER — LISINOPRIL 20 MG PO TABS
20.0000 mg | ORAL_TABLET | Freq: Every day | ORAL | Status: DC
  Administered 2013-05-23 – 2013-05-26 (×4): 20 mg via ORAL
  Filled 2013-05-23 (×4): qty 1

## 2013-05-23 MED ORDER — BUTAMBEN-TETRACAINE-BENZOCAINE 2-2-14 % EX AERO
INHALATION_SPRAY | CUTANEOUS | Status: DC | PRN
  Administered 2013-05-23: 2 via TOPICAL

## 2013-05-23 MED ORDER — FENTANYL CITRATE 0.05 MG/ML IJ SOLN
INTRAMUSCULAR | Status: AC
  Filled 2013-05-23: qty 2

## 2013-05-23 MED ORDER — MIDAZOLAM HCL 10 MG/2ML IJ SOLN
INTRAMUSCULAR | Status: DC | PRN
  Administered 2013-05-23 (×2): 2 mg via INTRAVENOUS

## 2013-05-23 MED ORDER — VANCOMYCIN HCL IN DEXTROSE 1-5 GM/200ML-% IV SOLN
1000.0000 mg | Freq: Two times a day (BID) | INTRAVENOUS | Status: DC
  Administered 2013-05-23 – 2013-05-25 (×5): 1000 mg via INTRAVENOUS
  Filled 2013-05-23 (×6): qty 200

## 2013-05-23 MED ORDER — MIDAZOLAM HCL 5 MG/ML IJ SOLN
INTRAMUSCULAR | Status: AC
  Filled 2013-05-23: qty 1

## 2013-05-23 MED ORDER — FENTANYL CITRATE 0.05 MG/ML IJ SOLN
INTRAMUSCULAR | Status: DC | PRN
  Administered 2013-05-23 (×2): 25 ug via INTRAVENOUS

## 2013-05-23 MED ORDER — PHENOL 1.4 % MT LIQD
1.0000 | OROMUCOSAL | Status: DC | PRN
  Administered 2013-05-23: 1 via OROMUCOSAL
  Filled 2013-05-23: qty 177

## 2013-05-23 MED ORDER — AMLODIPINE BESYLATE 5 MG PO TABS
5.0000 mg | ORAL_TABLET | Freq: Every day | ORAL | Status: DC
  Administered 2013-05-23 – 2013-05-26 (×4): 5 mg via ORAL
  Filled 2013-05-23 (×4): qty 1

## 2013-05-23 NOTE — Progress Notes (Addendum)
[place] [facility]  Day #3 vancomycin  Subjective: Patient not happy to learn he has endocarditis   Antibiotics:  Anti-infectives   Start     Dose/Rate Route Frequency Ordered Stop   05/23/13 0830  vancomycin (VANCOCIN) IVPB 1000 mg/200 mL premix     1,000 mg 200 mL/hr over 60 Minutes Intravenous Every 12 hours 05/23/13 0828     05/22/13 1332  levofloxacin (LEVAQUIN) tablet 500 mg  Status:  Discontinued     500 mg Oral 60 min pre-op 05/22/13 1332 05/22/13 1459   05/22/13 1200  cefTRIAXone (ROCEPHIN) 2 g in dextrose 5 % 50 mL IVPB  Status:  Discontinued     2 g 100 mL/hr over 30 Minutes Intravenous Every 24 hours 05/22/13 1016 05/22/13 1024   05/22/13 1030  vancomycin (VANCOCIN) IVPB 1000 mg/200 mL premix  Status:  Discontinued     1,000 mg 200 mL/hr over 60 Minutes Intravenous Every 12 hours 05/21/13 2225 05/22/13 1459   05/21/13 2230  vancomycin (VANCOCIN) 1,500 mg in sodium chloride 0.9 % 500 mL IVPB     1,500 mg 250 mL/hr over 120 Minutes Intravenous  Once 05/21/13 2224 05/22/13 0129      Medications: Scheduled Meds: . amLODipine  5 mg Oral Daily  . aspirin EC  81 mg Oral Daily  . enoxaparin (LOVENOX) injection  40 mg Subcutaneous Q24H  . ezetimibe  10 mg Oral q morning - 10a  . insulin aspart  0-5 Units Subcutaneous QHS  . insulin aspart  0-9 Units Subcutaneous TID WC  . lisinopril  20 mg Oral Daily  . loratadine  10 mg Oral Daily  . sodium chloride  3 mL Intravenous Q12H  . vancomycin  1,000 mg Intravenous Q12H   Continuous Infusions: . sodium chloride 75 mL/hr at 05/23/13 1722   PRN Meds:.acetaminophen, acetaminophen, HYDROcodone-acetaminophen, ondansetron (ZOFRAN) IV, ondansetron, phenol    Objective: Weight change: -2 lb 14.4 oz (-1.315 kg)  Intake/Output Summary (Last 24 hours) at 05/23/13 1732 Last data filed at 05/23/13 1500  Gross per 24 hour  Intake 2644.16 ml  Output    750 ml  Net 1894.16 ml   Blood pressure 122/78,  pulse 82, temperature 98.3 F (36.8 C), temperature source Oral, resp. rate 18, height 5\' 7"  (1.702 m), weight 170 lb 1.6 oz (77.157 kg), SpO2 97.00%. Temp:  [97.9 F (36.6 C)-99.2 F (37.3 C)] 98.3 F (36.8 C) (04/14 0931) Pulse Rate:  [72-83] 82 (04/14 1502) Resp:  [13-24] 18 (04/14 1502) BP: (109-146)/(46-84) 122/78 mmHg (04/14 1643) SpO2:  [96 %-100 %] 97 % (04/14 1502) Weight:  [170 lb 1.6 oz (77.157 kg)] 170 lb 1.6 oz (77.157 kg) (04/14 0500)  Physical Exam: General: Alert and awake, oriented x3, not in any acute distress.  HEENT: anicteric sclera, pupils reactive to light and accommodation, EOMI, oropharynx clear and without exudate  CVS regular rate, II/vi systolic murmur Chest: clear to auscultation bilaterally, no wheezing, rales or rhonchi  Abdomen: soft nontender, nondistended, normal bowel sounds,  Extremities: no clubbing or edema noted bilaterally  Skin: no rashes  Neuro: nonfocal, strength and sensation intact   CBC:  Recent Labs Lab 05/21/13 1856 05/22/13 0602 05/23/13 0415  HGB 14.2 12.6* 12.7*  HCT 41.8 37.3* 38.0*  PLT 222 187 202  INR  --  1.15  --      BMET  Recent Labs  05/22/13 0602 05/23/13 0415  NA 138 138  139  K 3.8 4.2  4.4  CL 102 104  104  CO2 20 20  19   GLUCOSE 130* 148*  147*  BUN 19 12  12   CREATININE 0.75 0.73  0.73  CALCIUM 8.9 9.0  9.0     Liver Panel   Recent Labs  05/21/13 1856 05/22/13 0602  PROT 7.3 6.2  ALBUMIN 3.6 3.0*  AST 23 15  ALT 21 16  ALKPHOS 73 60  BILITOT 0.5 0.6       Sedimentation Rate  Recent Labs  05/21/13 2304  ESRSEDRATE 24*   C-Reactive Protein  Recent Labs  05/21/13 2304 05/23/13 0415  CRP 4.7* 4.4*    Micro Results: Recent Results (from the past 240 hour(s))  CULTURE, BLOOD (ROUTINE X 2)     Status: None   Collection Time    05/21/13 10:27 PM      Result Value Ref Range Status   Specimen Description BLOOD LEFT ARM   Final   Special Requests BOTTLES DRAWN  AEROBIC AND ANAEROBIC [facility] EACH   Final   Culture  Setup Time     Final   Value: 05/22/2013 02:27     Performed at [facility]   Culture     Final   Value: GRAM POSITIVE COCCI IN CHAINS     Note: Gram Stain Report Called to,Read Back By and Verified With: MARIA LESTER AT 0608 05/23/13 BY SNOLO     Performed at [facility]   Report Status PENDING   Incomplete  CULTURE, BLOOD (ROUTINE X 2)     Status: None   Collection Time    05/21/13 10:30 PM      Result Value Ref Range Status   Specimen Description BLOOD RIGHT ARM   Final   Special Requests BOTTLES DRAWN AEROBIC ONLY 5CC   Final   Culture  Setup Time     Final   Value: 05/22/2013 02:27     Performed at [facility]   Culture     Final   Value: GRAM POSITIVE COCCI IN CHAINS     Note: Gram Stain Report Called to,Read Back By and Verified With: MARIA LESTER AT 8:07 P.M. ON 05/22/13 WARRB     Performed at [facility]   Report Status PENDING   Incomplete    Studies/Results: Dg Chest 2 View  05/22/2013   CLINICAL DATA:  Cough, history of back fracture.  Positive MRSA.  EXAM: CHEST  2 VIEW  COMPARISON:  DG CHEST 2V dated 04/21/2013  FINDINGS: Cardiomediastinal silhouette is unremarkable for this low inspiratory examination with crowded vasculature markings. Minimal bibasilar atelectasis. The lungs are otherwise clear without pleural effusions or focal consolidations. Trachea projects midline and there is no pneumothorax. Included soft tissue planes and osseous structures are non-suspicious. Lower thoracic posterior instrumentation.  IMPRESSION: Minimal bibasilar atelectasis in this low inspiratory portable examination   Electronically Signed   By: Elon Alas   On: 05/22/2013 00:37   Dg Thoracic Spine W/swimmers  05/22/2013   CLINICAL DATA:  History of back fracture, MRSA.  EXAM: THORACIC SPINE - 2 VIEW + SWIMMERS  COMPARISON:  DG THORACIC SPINE 2V dated 03/07/2013; DG CHEST 2V dated 04/21/2013  FINDINGS:  Posterior instrumentation and apparent bone graft material transfix single lower thoracic level moderate compression fracture. No acute fracture deformity or dislocation. Intervertebral disc heights generally preserved. No destructive bony lesions. Included prevertebral and paraspinal soft tissue planes are nonsuspicious.  IMPRESSION: Stable appearance of thoracic spine without acute fracture deformity  or malalignment.   Electronically Signed   By: Elon Alas   On: 05/22/2013 00:42   Dg Lumbar Spine 2-3 Views  05/22/2013   CLINICAL DATA:  Back fracture, MRSA.  EXAM: LUMBAR SPINE - 2-3 VIEW  COMPARISON:  DG ABDOMEN 1V dated 04/26/2013  FINDINGS: Six non rib-bearing lumbar type vertebral bodies. Lumbar vertebral bodies appear intact and aligned with maintenance of lumbar lordosis. Mild-to-moderate degenerative disc disease at all levels. Moderate lower lumbar facet arthropathy. Sacroiliac joints are symmetric.  Lower thoracic posterior instrumentation. Included prevertebral and paraspinal soft tissue planes are nonsuspicious.  IMPRESSION: Degenerative change of the lumbar spine without acute fracture deformity or malalignment.   Electronically Signed   By: Elon Alas   On: 05/22/2013 00:48   Ct Chest W Contrast  05/22/2013   CLINICAL DATA:  Two months of generalized weakness, weight and fever  EXAM: CT CHEST, ABDOMEN, AND PELVIS WITH CONTRAST  TECHNIQUE: Multidetector CT imaging of the chest, abdomen and pelvis was performed following the standard protocol during bolus administration of intravenous contrast.  CONTRAST:  137mL OMNIPAQUE IOHEXOL 300 MG/ML  SOLN  COMPARISON:  DG THORACIC SPINE W/SWIMMERS dated 05/22/2013; CT T SPINE W/O CM dated 10/04/2012  FINDINGS: CT CHEST FINDINGS  No axillary or supraclavicular lymphadenopathy. No mediastinal hilar lymphadenopathy. Mild basilar atelectasis. No evidence of pulmonary infection. Airways are normal.  CT ABDOMEN AND PELVIS FINDINGS  No focal hepatic  lesion. Small gallstone within the gallbladder. No evidence of gallbladder wall inflammation. The pancreas, spleen, adrenal glands are normal. There is a 10 mm calculus at the right ureteropelvic junction. Mild pelvicaliectasis on the right. Several small nonobstructing left renal calculi are noted. No ureterolithiasis evident.  Stomach, small bowel, and distal appendix are normal. The appendix does extend inferiorly into a right inguinal hernia. There is thickened tissue at the orifice of the appendix extending into the cecum measuring 18 mm x20 mm (image 95, series 2). The colon and rectosigmoid colon are otherwise normal.  Abdominal aorta is normal caliber. No retroperitoneal or periportal lymphadenopathy.  No free fluid the pelvis. The prostate gland and bladder normal. Bilateral fat filled inguinal hernias. No aggressive osseous lesion. Posterior thoracic fusion noted.  IMPRESSION: *Thickened tissue at the orifice of the appendix extending into the cecum. Recommend colonoscopy for evaluation and potential sampling to exclude appendiceal carcinoma. Alternatively this could relate to chronic inflammation. *Mid and distal appendix are normal with the tip extending into a right inguinal hernia. *Mildly obstructing calculus at the right ureteropelvic junction with pelvicaliectasis. *Bilateral nephrolithiasis. *Cholelithiasis without evidence of cholecystitis.   Electronically Signed   By: Suzy Bouchard M.D.   On: 05/22/2013 18:50   Ct Abdomen Pelvis W Contrast  05/22/2013   CLINICAL DATA:  Two months of generalized weakness, weight and fever  EXAM: CT CHEST, ABDOMEN, AND PELVIS WITH CONTRAST  TECHNIQUE: Multidetector CT imaging of the chest, abdomen and pelvis was performed following the standard protocol during bolus administration of intravenous contrast.  CONTRAST:  167mL OMNIPAQUE IOHEXOL 300 MG/ML  SOLN  COMPARISON:  DG THORACIC SPINE W/SWIMMERS dated 05/22/2013; CT T SPINE W/O CM dated 10/04/2012   FINDINGS: CT CHEST FINDINGS  No axillary or supraclavicular lymphadenopathy. No mediastinal hilar lymphadenopathy. Mild basilar atelectasis. No evidence of pulmonary infection. Airways are normal.  CT ABDOMEN AND PELVIS FINDINGS  No focal hepatic lesion. Small gallstone within the gallbladder. No evidence of gallbladder wall inflammation. The pancreas, spleen, adrenal glands are normal. There is a 10 mm calculus at  the right ureteropelvic junction. Mild pelvicaliectasis on the right. Several small nonobstructing left renal calculi are noted. No ureterolithiasis evident.  Stomach, small bowel, and distal appendix are normal. The appendix does extend inferiorly into a right inguinal hernia. There is thickened tissue at the orifice of the appendix extending into the cecum measuring 18 mm x20 mm (image 95, series 2). The colon and rectosigmoid colon are otherwise normal.  Abdominal aorta is normal caliber. No retroperitoneal or periportal lymphadenopathy.  No free fluid the pelvis. The prostate gland and bladder normal. Bilateral fat filled inguinal hernias. No aggressive osseous lesion. Posterior thoracic fusion noted.  IMPRESSION: *Thickened tissue at the orifice of the appendix extending into the cecum. Recommend colonoscopy for evaluation and potential sampling to exclude appendiceal carcinoma. Alternatively this could relate to chronic inflammation. *Mid and distal appendix are normal with the tip extending into a right inguinal hernia. *Mildly obstructing calculus at the right ureteropelvic junction with pelvicaliectasis. *Bilateral nephrolithiasis. *Cholelithiasis without evidence of cholecystitis.   Electronically Signed   By: Suzy Bouchard M.D.   On: 05/22/2013 18:50      Assessment/Plan:  Active Problems:   FUO (fever of unknown origin)   H/O spinal fusion   H/O Spinal surgery   Bacteremia    Jonathan Blackburn is a 68 y.o. 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, due to Viridans group  streptococci  #1 Viridans group streptococcal endocarditis:  I have called the office in [place] and staff there endeavoring to get me data on PCN MIC for the Viridans Strep (report simply stated R to PCN)  The AHA guidelines for native valve endocarditis for Viridans Group Streptococci with PCN MIC > .5 are rx as for Enterococcal endocarditis with   IV PCN  + gentamicin x 6 weeks  Vancomycin is an alternative for those who cannot tolerate beta-lactam + AG  I would like to change pt over to high dose PCN and gentamicin but I would like to have greater certainty with re to the ID of organism and MIC  We should have our own data soon. Lab personnel at pts clinic were going to call [place] and try to get that data (all they have in computer is R for PCN)  Once we have formal PCN MIC that make sense  I would change to HIGH DOSE CONTINUOUS PCN + gentamicin  In the interim I will leave him on vancomycin  Repeat blood cultures today  DO NOT PLACE PICC until we have several days of proof of clearing of his blood cultures  I would like to have pt observed in the hospital for a few days on this regimen to ensure he does not develop AG nephrotoxicity and that his cultures are clearing  I will order panorex of teeth, though his GI tract could also be source  ONce he is ready for DC without nephrotoxicity would then dc with home infusion company and make sure that bmp, gentamicin levels faxed to [place] @ [phone]  He will need BI-WEEKLY BMP faxed to [place]  He should be dc with home infusion co where their pharmacy can properly dose the gentamicin per protocol  He should have CD ROMs made of imaging for his Cardiologist with re to his MR and also scan of abdomen for his GI MD so he can have endoscopy to look at area in cecum  I spent well over  40 minutes with the patient including greater than 50% of time in face to face counsel  of the patient and in coordination of their care.  #2 ? Thickening in  cecum: see above will need endoscopic evaluation of this area  #3 Screening: Hep panel negative, HIV pending  Dr. Baxter Flattery will be covering tomorrow.    LOS: 2 days   Truman Hayward 05/23/2013, 5:32 PM

## 2013-05-23 NOTE — CV Procedure (Signed)
Posterior mitral valve leaflet vegetation present. 0.8 x 0.36cm. Small in size.   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 Mild anterior directed MR. (Patient states  Normal EF.   Positive study - endocarditis.   See full report for details. Discussed with patient.   Candee Furbish, MD

## 2013-05-23 NOTE — Interval H&P Note (Signed)
History and Physical Interval Note:  05/23/2013 7:55 AM  Jonathan Blackburn  has presented today for surgery, with the diagnosis of POSITIVE BLOOD CULTURES  The various methods of treatment have been discussed with the patient and family. After consideration of risks, benefits and other options for treatment, the patient has consented to  Procedure(s): TRANSESOPHAGEAL ECHOCARDIOGRAM (TEE) (N/A) as a surgical intervention .  The patient's history has been reviewed, patient examined, no change in status, stable for surgery.  I have reviewed the patient's chart and labs.  Questions were answered to the patient's satisfaction.     [employer]

## 2013-05-23 NOTE — Progress Notes (Addendum)
[facility] PROGRESS NOTE  Jonathan Blackburn ELF:[id number] DOB: [birth year] DOA: 05/21/2013 PCP: Fae Pippin  Interim Summary Patient is a pleasant 68 year old gentleman with a past medical history of type 2 diabetes mellitus, hypertension, admitted to the medicine service on 05/21/2013. He has had fevers, chills, night sweats since the end of January associated with generalized weakness, malaise, fatigue, poor tolerance to physical exertion. He also endorsed a 20 pound weight loss during this time. He was treated for prostatitis by his primary care physician with Levaquin. Blood cultures were checked, growing Streptococcus, organism susceptible to vancomycin levofloxacin clindamycin chloramphenicol with intermediate susceptibility to ceftriaxone cefepime, resistant to Cefotaxime. Case discussed with infectious disease and with cardiology. Given high suspicion for infectious endocarditis he had a transesophageal echocardiogram that was performed on 05/23/2013 which showed a small 8 mm x 3 mm screw and like, a mobile vegetation on the atrial aspect of the body of the posterior leaflet. Findings suggestive of endocarditis. Meanwhile his blood cultures drawn on 05/21/2013 have grown gram-positive cocci in chains from both cultures. Infectious disease following, patient remains on IV vancomycin. He remains hemodynamically stable, afebrile, without clinical signs or symptoms of congestive heart failure.                                                                                               Assessment/Plan: 1. Endocarditis.  -Patient presenting with a 2 to 3-month history of generalized weakness, malaise, fevers, chills, night sweats, 20 pound weight loss.  -Blood cultures obtained at his primary care physician's office on 05/17/13 growing Viridans Streptococcus group.  -Blood culture report faxed to [facility], it appears organism is susceptible to vancomycin, levofloxacin clindamycin, with intermediate  susceptibility to erythromycin ceftriaxone and cefepime, resistant to penicillin and cefotaxime.  -Blood culture report left in paper chart.   -Transesophageal echocardiogram showing mobile vegetation on the atrial aspect of the body of the posterior leaflet -Repeat Blood Cultures drawn on 05/21/2013 growing gram-positive cocci in chains from both cultures  -Remains on IV vancomycin -Await further recs from ID -Hemodynamically stable, no evidence of CHF 2.  Type 2 diabetes mellitus - Blood sugars stable -Continue on Accu-Cheks with sliding scale coverage 3. Hypertension -Blood pressures improved, will restart amlodipine with holding parameters 4. Thickened Tissue on Appendix -CT scan of abdomen and pelvis showing thickened tissue at the orifice of the appendix extending up to the cecum. -Radiology report stating a recommendation for colonoscopy for evaluation and potential sampling to exclude appendiceal carcinoma. -GI consulted placed for further recommendations, spoke with Dr Collene Mares 5. DVT prophylaxis -Subcutaneous Lovenox   Code Status: Full Code Family Communication: I spoke with patient's significant other at bedside Disposition Plan: Continue IV antimicrobial therapy, ID following, GI consulted for CT scan findings   Consultants:  ID  Cardiology  Procedures:  Transesophageal echocardiogram performed on 05/23/2013  Antibiotics:  Vancomycin (started on 05/21/13)  HPI/Subjective:  Patient seen after TEE. Tolerating by mouth intake, has no complaints, overall states feeling better. He denies fevers chills nausea or vomiting.  Objective: Filed Vitals:   05/23/13 1643  BP: 122/78  Pulse:   Temp:  Resp:     Intake/Output Summary (Last 24 hours) at 05/23/13 1717 Last data filed at 05/23/13 1500  Gross per 24 hour  Intake 2644.16 ml  Output    750 ml  Net 1894.16 ml   Filed Weights   05/21/13 1851 05/22/13 0059 05/23/13 0500  Weight: 78.472 kg (173 lb) 77.4 kg  (170 lb 10.2 oz) 77.157 kg (170 lb 1.6 oz)    Exam:   General:  Patient is in no acute distress, nontoxic appearing awake alert and oriented  Cardiovascular: Regular rate and rhythm normal S1-S2  Respiratory: Clear to auscultation bilaterally  Abdomen: No pain, positive bowel sounds  Musculoskeletal: No edema  Skin: Did not note rashes  Data Reviewed: Basic Metabolic Panel:  Recent Labs Lab 05/21/13 1856 05/22/13 0602 05/23/13 0415  NA 141 138 138  139  K 4.0 3.8 4.2  4.4  CL 102 102 104  104  CO2 21 20 20  19   GLUCOSE 152* 130* 148*  147*  BUN 28* 19 12  12   CREATININE 0.94 0.75 0.73  0.73  CALCIUM 9.6 8.9 9.0  9.0   Liver Function Tests:  Recent Labs Lab 05/21/13 1856 05/22/13 0602  AST 23 15  ALT 21 16  ALKPHOS 73 60  BILITOT 0.5 0.6  PROT 7.3 6.2  ALBUMIN 3.6 3.0*   No results found for this basename: LIPASE, AMYLASE,  in the last 168 hours No results found for this basename: AMMONIA,  in the last 168 hours CBC:  Recent Labs Lab 05/21/13 1856 05/22/13 0602 05/23/13 0415  WBC 9.2 7.1 7.5  NEUTROABS 7.5  --   --   HGB 14.2 12.6* 12.7*  HCT 41.8 37.3* 38.0*  MCV 82.1 80.4 81.2  PLT 222 187 202   Cardiac Enzymes:  Recent Labs Lab 05/23/13 0415  CKTOTAL 38   BNP (last 3 results) No results found for this basename: PROBNP,  in the last 8760 hours CBG:  Recent Labs Lab 05/22/13 1601 05/22/13 2301 05/23/13 0614 05/23/13 1209 05/23/13 1640  GLUCAP 171* 137* 164* 121* 164*    Recent Results (from the past 240 hour(s))  CULTURE, BLOOD (ROUTINE X 2)     Status: None   Collection Time    05/21/13 10:27 PM      Result Value Ref Range Status   Specimen Description BLOOD LEFT ARM   Final   Special Requests BOTTLES DRAWN AEROBIC AND ANAEROBIC [facility] EACH   Final   Culture  Setup Time     Final   Value: 05/22/2013 02:27     Performed at [facility]   Culture     Final   Value: GRAM POSITIVE COCCI IN CHAINS     Note: Gram  Stain Report Called to,Read Back By and Verified With: MARIA LESTER AT 0608 05/23/13 BY SNOLO     Performed at [facility]   Report Status PENDING   Incomplete  CULTURE, BLOOD (ROUTINE X 2)     Status: None   Collection Time    05/21/13 10:30 PM      Result Value Ref Range Status   Specimen Description BLOOD RIGHT ARM   Final   Special Requests BOTTLES DRAWN AEROBIC ONLY 5CC   Final   Culture  Setup Time     Final   Value: 05/22/2013 02:27     Performed at [facility]   Culture     Final   Value: [place]  Note: Gram Stain Report Called to,Read Back By and Verified With: MARIA LESTER AT 8:07 P.M. ON 05/22/13 [facility]     Performed at [facility]   Report Status PENDING   Incomplete     Studies: Dg Chest 2 View  05/22/2013   CLINICAL DATA:  Cough, history of back fracture.  Positive MRSA.  EXAM: CHEST  2 VIEW  COMPARISON:  DG CHEST 2V dated 04/21/2013  FINDINGS: Cardiomediastinal silhouette is unremarkable for this low inspiratory examination with crowded vasculature markings. Minimal bibasilar atelectasis. The lungs are otherwise clear without pleural effusions or focal consolidations. Trachea projects midline and there is no pneumothorax. Included soft tissue planes and osseous structures are non-suspicious. Lower thoracic posterior instrumentation.  IMPRESSION: Minimal bibasilar atelectasis in this low inspiratory portable examination   Electronically Signed   By: Elon Alas   On: 05/22/2013 00:37   Dg Thoracic Spine W/swimmers  05/22/2013   CLINICAL DATA:  History of back fracture, MRSA.  EXAM: THORACIC SPINE - 2 VIEW + SWIMMERS  COMPARISON:  DG THORACIC SPINE 2V dated 03/07/2013; DG CHEST 2V dated 04/21/2013  FINDINGS: Posterior instrumentation and apparent bone graft material transfix single lower thoracic level moderate compression fracture. No acute fracture deformity or dislocation. Intervertebral disc heights generally preserved. No  destructive bony lesions. Included prevertebral and paraspinal soft tissue planes are nonsuspicious.  IMPRESSION: Stable appearance of thoracic spine without acute fracture deformity or malalignment.   Electronically Signed   By: Elon Alas   On: 05/22/2013 00:42   Dg Lumbar Spine 2-3 Views  05/22/2013   CLINICAL DATA:  Back fracture, MRSA.  EXAM: LUMBAR SPINE - 2-3 VIEW  COMPARISON:  DG ABDOMEN 1V dated 04/26/2013  FINDINGS: Six non rib-bearing lumbar type vertebral bodies. Lumbar vertebral bodies appear intact and aligned with maintenance of lumbar lordosis. Mild-to-moderate degenerative disc disease at all levels. Moderate lower lumbar facet arthropathy. Sacroiliac joints are symmetric.  Lower thoracic posterior instrumentation. Included prevertebral and paraspinal soft tissue planes are nonsuspicious.  IMPRESSION: Degenerative change of the lumbar spine without acute fracture deformity or malalignment.   Electronically Signed   By: Elon Alas   On: 05/22/2013 00:48   Ct Chest W Contrast  05/22/2013   CLINICAL DATA:  Two months of generalized weakness, weight and fever  EXAM: CT CHEST, ABDOMEN, AND PELVIS WITH CONTRAST  TECHNIQUE: Multidetector CT imaging of the chest, abdomen and pelvis was performed following the standard protocol during bolus administration of intravenous contrast.  CONTRAST:  178mL OMNIPAQUE IOHEXOL 300 MG/ML  SOLN  COMPARISON:  DG THORACIC SPINE W/SWIMMERS dated 05/22/2013; CT T SPINE W/O CM dated 10/04/2012  FINDINGS: CT CHEST FINDINGS  No axillary or supraclavicular lymphadenopathy. No mediastinal hilar lymphadenopathy. Mild basilar atelectasis. No evidence of pulmonary infection. Airways are normal.  CT ABDOMEN AND PELVIS FINDINGS  No focal hepatic lesion. Small gallstone within the gallbladder. No evidence of gallbladder wall inflammation. The pancreas, spleen, adrenal glands are normal. There is a 10 mm calculus at the right ureteropelvic junction. Mild pelvicaliectasis  on the right. Several small nonobstructing left renal calculi are noted. No ureterolithiasis evident.  Stomach, small bowel, and distal appendix are normal. The appendix does extend inferiorly into a right inguinal hernia. There is thickened tissue at the orifice of the appendix extending into the cecum measuring 18 mm x20 mm (image 95, series 2). The colon and rectosigmoid colon are otherwise normal.  Abdominal aorta is normal caliber. No retroperitoneal or periportal lymphadenopathy.  No free fluid  the pelvis. The prostate gland and bladder normal. Bilateral fat filled inguinal hernias. No aggressive osseous lesion. Posterior thoracic fusion noted.  IMPRESSION: *Thickened tissue at the orifice of the appendix extending into the cecum. Recommend colonoscopy for evaluation and potential sampling to exclude appendiceal carcinoma. Alternatively this could relate to chronic inflammation. *Mid and distal appendix are normal with the tip extending into a right inguinal hernia. *Mildly obstructing calculus at the right ureteropelvic junction with pelvicaliectasis. *Bilateral nephrolithiasis. *Cholelithiasis without evidence of cholecystitis.   Electronically Signed   By: Suzy Bouchard M.D.   On: 05/22/2013 18:50   Ct Abdomen Pelvis W Contrast  05/22/2013   CLINICAL DATA:  Two months of generalized weakness, weight and fever  EXAM: CT CHEST, ABDOMEN, AND PELVIS WITH CONTRAST  TECHNIQUE: Multidetector CT imaging of the chest, abdomen and pelvis was performed following the standard protocol during bolus administration of intravenous contrast.  CONTRAST:  160mL OMNIPAQUE IOHEXOL 300 MG/ML  SOLN  COMPARISON:  DG THORACIC SPINE W/SWIMMERS dated 05/22/2013; CT T SPINE W/O CM dated 10/04/2012  FINDINGS: CT CHEST FINDINGS  No axillary or supraclavicular lymphadenopathy. No mediastinal hilar lymphadenopathy. Mild basilar atelectasis. No evidence of pulmonary infection. Airways are normal.  CT ABDOMEN AND PELVIS FINDINGS  No  focal hepatic lesion. Small gallstone within the gallbladder. No evidence of gallbladder wall inflammation. The pancreas, spleen, adrenal glands are normal. There is a 10 mm calculus at the right ureteropelvic junction. Mild pelvicaliectasis on the right. Several small nonobstructing left renal calculi are noted. No ureterolithiasis evident.  Stomach, small bowel, and distal appendix are normal. The appendix does extend inferiorly into a right inguinal hernia. There is thickened tissue at the orifice of the appendix extending into the cecum measuring 18 mm x20 mm (image 95, series 2). The colon and rectosigmoid colon are otherwise normal.  Abdominal aorta is normal caliber. No retroperitoneal or periportal lymphadenopathy.  No free fluid the pelvis. The prostate gland and bladder normal. Bilateral fat filled inguinal hernias. No aggressive osseous lesion. Posterior thoracic fusion noted.  IMPRESSION: *Thickened tissue at the orifice of the appendix extending into the cecum. Recommend colonoscopy for evaluation and potential sampling to exclude appendiceal carcinoma. Alternatively this could relate to chronic inflammation. *Mid and distal appendix are normal with the tip extending into a right inguinal hernia. *Mildly obstructing calculus at the right ureteropelvic junction with pelvicaliectasis. *Bilateral nephrolithiasis. *Cholelithiasis without evidence of cholecystitis.   Electronically Signed   By: Suzy Bouchard M.D.   On: 05/22/2013 18:50    Scheduled Meds: . amLODipine  5 mg Oral Daily  . aspirin EC  81 mg Oral Daily  . enoxaparin (LOVENOX) injection  40 mg Subcutaneous Q24H  . ezetimibe  10 mg Oral q morning - 10a  . insulin aspart  0-5 Units Subcutaneous QHS  . insulin aspart  0-9 Units Subcutaneous TID WC  . lisinopril  20 mg Oral Daily  . loratadine  10 mg Oral Daily  . sodium chloride  3 mL Intravenous Q12H  . vancomycin  1,000 mg Intravenous Q12H   Continuous Infusions: . sodium  chloride Stopped (05/23/13 0431)    Active Problems:   FUO (fever of unknown origin)   H/O spinal fusion   H/O Spinal surgery   Bacteremia    Time spent: 40 minutes    [facility] Pager [phone]. If 7PM-7AM, please contact night-coverage at [email or web address], password [email or web address] 05/23/2013, 5:17 PM  LOS: 2 days

## 2013-05-23 NOTE — Progress Notes (Signed)
  Echocardiogram Echocardiogram Transesophageal has been performed.  Jonathan Blackburn [name] 05/23/2013, 11:18 AM

## 2013-05-23 NOTE — H&P (View-Only) (Signed)
[facility] for Infectious Disease    Date of Admission:  05/21/2013  Date of Consult:  05/22/2013  Reason for Consult: Viridans bacteremia and fevers Referring Physician: Dr. Coralyn Pear   HPI: Jonathan Blackburn is an 68 y.o. male with medical history of diabetes, hypertension, aortic murmur, lumbar fusion, thoracic spine rod surgery.  The patient presented with complain of fever that has been ongoing since January end 2015. He mentions the fever primarily occurs in the night around midnight, initially he was having generalized weakness tired malaise and night sweats. He also claims to have lost approximately 20 pounds during that time.  History various antibiotics including doxycycline for possible sinusitis and levofloxacin for possible prostatitis. His chest x-rays and urine cultures were negative.  He had a blood culture drawn from one site on 52/09/221 would certainly old and a viridans group streptococcal species. Susceptibilities and report are pictured below in the photograph. Of note the culture was done that only one arm and not with a simultaneous blood culture from another site to help assist in the assessment of whether this culture was a true positive or contaminant. Since then he had a second blood culture obtained on the 10th. This is felt growing organism. He was called by his physician from [facility] and asked to come to the hospital admitted to the hospitalist service for workup for bacteremia.    He had been placed on vancomycin after blood cultures were drawn on admission. Blood cultures drawn here and the second blood culture drawn on the 10th in [place] have failed to grow any organisms to date. This raises the question of whether the culture on the eighth was actually a contaminant rather than true pathogen. He's really does however have fevers that require workup and I think proceeding with a transesophageal echocardiogram may assist in the workup of  his fevers      Past Medical History  Diagnosis Date  . Diabetes mellitus without complication   . Hypertension   . Heart murmur   . Arthritis     Past Surgical History  Procedure Laterality Date  . Back surgery    . Shoulder arthroscopy      rotator cuff  . Posterior lumbar fusion 4 level N/A 11/16/2012    Procedure: Thoracic seven to thoracic eleven posterior lateral thoracic arthrodesis with instrumentation and bone graft ;  Surgeon: Ophelia Charter, MD;  Location: [facility];  Service: Neurosurgery;  Laterality: N/A;  Thoracic seven to thoracic eleven posterior lateral thoracic arthrodesis with instrumentation and bone graft  ergies:   No Known Allergies   Medications: I have reviewed patients current medications as documented in Epic Anti-infectives   Start     Dose/Rate Route Frequency Ordered Stop   05/22/13 1332  levofloxacin (LEVAQUIN) tablet 500 mg  Status:  Discontinued     500 mg Oral 60 min pre-op 05/22/13 1332 05/22/13 1459   05/22/13 1200  cefTRIAXone (ROCEPHIN) 2 g in dextrose 5 % 50 mL IVPB  Status:  Discontinued     2 g 100 mL/hr over 30 Minutes Intravenous Every 24 hours 05/22/13 1016 05/22/13 1024   05/22/13 1030  vancomycin (VANCOCIN) IVPB 1000 mg/200 mL premix  Status:  Discontinued     1,000 mg 200 mL/hr over 60 Minutes Intravenous Every 12 hours 05/21/13 2225 05/22/13 1459   05/21/13 2230  vancomycin (VANCOCIN) 1,500 mg in sodium chloride 0.9 % 500 mL IVPB     1,500 mg 250 mL/hr  over 120 Minutes Intravenous  Once 05/21/13 2224 05/22/13 0129      Social History:  reports that he has never smoked. He does not have any smokeless tobacco history on file. He reports that he drinks alcohol. He reports that he does not use illicit drugs.  No family history on file.  As in HPI and primary teams notes otherwise 12 point review of systems is negative  Blood pressure 112/62, pulse 80, temperature 98.9 F (37.2 C), temperature source Oral, resp. rate  18, height _0  (1.702 m), weight 170 lb 10.2 oz (77.4 kg), SpO2 98.00%. General: Alert and awake, oriented x3, not in any acute distress. HEENT: anicteric sclera, pupils reactive to light and accommodation, EOMI, oropharynx clear and without exudate CVS regular rate, normal r,  no murmur rubs or gallops Chest: clear to auscultation bilaterally, no wheezing, rales or rhonchi Abdomen: soft nontender, nondistended, normal bowel sounds, Extremities: no  clubbing or edema noted bilaterally Skin: no rashes Neuro: nonfocal, strength and sensation intact   Results for orders placed during the hospital encounter of 05/21/13 (from the past 48 hour(s))  CBC WITH DIFFERENTIAL     Status: Abnormal   Collection Time    05/21/13  6:56 PM      Result Value Ref Range   WBC 9.2  4.0 - 10.5 K/uL   RBC 5.09  4.22 - 5.81 MIL/uL   Hemoglobin 14.2  13.0 - 17.0 g/dL   HCT 41.8  39.0 - 52.0 %   MCV 82.1  78.0 - 100.0 fL   MCH 27.9  26.0 - 34.0 pg   MCHC 34.0  30.0 - 36.0 g/dL   RDW 14.9  11.5 - 15.5 %   Platelets 222  150 - 400 K/uL   Neutrophils Relative % 82 (*) 43 - 77 %   Neutro Abs 7.5  1.7 - 7.7 K/uL   Lymphocytes Relative 8 (*) 12 - 46 %   Lymphs Abs 0.7  0.7 - 4.0 K/uL   Monocytes Relative 9  3 - 12 %   Monocytes Absolute 0.8  0.1 - 1.0 K/uL   Eosinophils Relative 1  0 - 5 %   Eosinophils Absolute 0.1  0.0 - 0.7 K/uL   Basophils Relative 0  0 - 1 %   Basophils Absolute 0.0  0.0 - 0.1 K/uL  COMPREHENSIVE METABOLIC PANEL     Status: Abnormal   Collection Time    05/21/13  6:56 PM      Result Value Ref Range   Sodium 141  137 - 147 mEq/L   Potassium 4.0  3.7 - 5.3 mEq/L   Chloride 102  96 - 112 mEq/L   CO2 21  19 - 32 mEq/L   Glucose, Bld 152 (*) 70 - 99 mg/dL   BUN 28 (*) 6 - 23 mg/dL   Creatinine, Ser 0.94  0.50 - 1.35 mg/dL   Calcium 9.6  8.4 - 10.5 mg/dL   Total Protein 7.3  6.0 - 8.3 g/dL   Albumin 3.6  3.5 - 5.2 g/dL   AST 23  0 - 37 U/L   ALT 21  0 - 53 U/L   Alkaline  Phosphatase 73  39 - 117 U/L   Total Bilirubin 0.5  0.3 - 1.2 mg/dL   GFR calc non Af Amer 85 (*) >90 mL/min   GFR calc Af Amer >90  >90 mL/min   Comment: (NOTE)     The eGFR has been calculated using  the CKD EPI equation.     This calculation has not been validated in all clinical situations.     eGFR's persistently <90 mL/min signify possible Chronic Kidney     Disease.  SEDIMENTATION RATE     Status: Abnormal   Collection Time    05/21/13 11:04 PM      Result Value Ref Range   Sed Rate 24 (*) 0 - 16 mm/hr  C-REACTIVE PROTEIN     Status: Abnormal   Collection Time    05/21/13 11:04 PM      Result Value Ref Range   CRP 4.7 (*) <0.60 mg/dL   Comment: Performed at [facility]     Status: None   Collection Time    05/21/13 11:35 PM      Result Value Ref Range   Color, Urine YELLOW  YELLOW   APPearance CLEAR  CLEAR   Specific Gravity, Urine 1.024  1.005 - 1.030   pH 5.0  5.0 - 8.0   Glucose, UA NEGATIVE  NEGATIVE mg/dL   Hgb urine dipstick NEGATIVE  NEGATIVE   Bilirubin Urine NEGATIVE  NEGATIVE   Ketones, ur NEGATIVE  NEGATIVE mg/dL   Protein, ur NEGATIVE  NEGATIVE mg/dL   Urobilinogen, UA 0.2  0.0 - 1.0 mg/dL   Nitrite NEGATIVE  NEGATIVE   Leukocytes, UA NEGATIVE  NEGATIVE   Comment: MICROSCOPIC NOT DONE ON URINES WITH NEGATIVE PROTEIN, BLOOD, LEUKOCYTES, NITRITE, OR GLUCOSE <1000 mg/dL.  COMPREHENSIVE METABOLIC PANEL     Status: Abnormal   Collection Time    05/22/13  6:02 AM      Result Value Ref Range   Sodium 138  137 - 147 mEq/L   Potassium 3.8  3.7 - 5.3 mEq/L   Chloride 102  96 - 112 mEq/L   CO2 20  19 - 32 mEq/L   Glucose, Bld 130 (*) 70 - 99 mg/dL   BUN 19  6 - 23 mg/dL   Creatinine, Ser 0.75  0.50 - 1.35 mg/dL   Calcium 8.9  8.4 - 10.5 mg/dL   Total Protein 6.2  6.0 - 8.3 g/dL   Albumin 3.0 (*) 3.5 - 5.2 g/dL   AST 15  0 - 37 U/L   ALT 16  0 - 53 U/L   Alkaline Phosphatase 60  39 - 117 U/L   Total  Bilirubin 0.6  0.3 - 1.2 mg/dL   GFR calc non Af Amer >90  >90 mL/min   GFR calc Af Amer >90  >90 mL/min   Comment: (NOTE)     The eGFR has been calculated using the CKD EPI equation.     This calculation has not been validated in all clinical situations.     eGFR's persistently <90 mL/min signify possible Chronic Kidney     Disease.  CBC     Status: Abnormal   Collection Time    05/22/13  6:02 AM      Result Value Ref Range   WBC 7.1  4.0 - 10.5 K/uL   RBC 4.64  4.22 - 5.81 MIL/uL   Hemoglobin 12.6 (*) 13.0 - 17.0 g/dL   HCT 37.3 (*) 39.0 - 52.0 %   MCV 80.4  78.0 - 100.0 fL   MCH 27.2  26.0 - 34.0 pg   MCHC 33.8  30.0 - 36.0 g/dL   RDW 14.9  11.5 - 15.5 %   Platelets 187  150 - 400 K/uL  PROTIME-INR     Status: None   Collection Time    05/22/13  6:02 AM      Result Value Ref Range   Prothrombin Time 14.5  11.6 - 15.2 seconds   INR 1.15  0.00 - 1.49  GLUCOSE, CAPILLARY     Status: Abnormal   Collection Time    05/22/13  7:32 AM      Result Value Ref Range   Glucose-Capillary 118 (*) 70 - 99 mg/dL  GLUCOSE, CAPILLARY     Status: Abnormal   Collection Time    05/22/13 11:00 AM      Result Value Ref Range   Glucose-Capillary 120 (*) 70 - 99 mg/dL  GLUCOSE, CAPILLARY     Status: Abnormal   Collection Time    05/22/13  4:01 PM      Result Value Ref Range   Glucose-Capillary 171 (*) 70 - 99 mg/dL   No results found for this basename: sdes, specrequest, cult, reptstatus   Dg Chest 2 View  05/22/2013   CLINICAL DATA:  Cough, history of back fracture.  Positive MRSA.  EXAM: CHEST  2 VIEW  COMPARISON:  DG CHEST 2V dated 04/21/2013  FINDINGS: Cardiomediastinal silhouette is unremarkable for this low inspiratory examination with crowded vasculature markings. Minimal bibasilar atelectasis. The lungs are otherwise clear without pleural effusions or focal consolidations. Trachea projects midline and there is no pneumothorax. Included soft tissue planes and osseous structures are  non-suspicious. Lower thoracic posterior instrumentation.  IMPRESSION: Minimal bibasilar atelectasis in this low inspiratory portable examination   Electronically Signed   By: Elon Alas   On: 05/22/2013 00:37   Dg Thoracic Spine W/swimmers  05/22/2013   CLINICAL DATA:  History of back fracture, MRSA.  EXAM: THORACIC SPINE - 2 VIEW + SWIMMERS  COMPARISON:  DG THORACIC SPINE 2V dated 03/07/2013; DG CHEST 2V dated 04/21/2013  FINDINGS: Posterior instrumentation and apparent bone graft material transfix single lower thoracic level moderate compression fracture. No acute fracture deformity or dislocation. Intervertebral disc heights generally preserved. No destructive bony lesions. Included prevertebral and paraspinal soft tissue planes are nonsuspicious.  IMPRESSION: Stable appearance of thoracic spine without acute fracture deformity or malalignment.   Electronically Signed   By: Elon Alas   On: 05/22/2013 00:42   Dg Lumbar Spine 2-3 Views  05/22/2013   CLINICAL DATA:  Back fracture, MRSA.  EXAM: LUMBAR SPINE - 2-3 VIEW  COMPARISON:  DG ABDOMEN 1V dated 04/26/2013  FINDINGS: Six non rib-bearing lumbar type vertebral bodies. Lumbar vertebral bodies appear intact and aligned with maintenance of lumbar lordosis. Mild-to-moderate degenerative disc disease at all levels. Moderate lower lumbar facet arthropathy. Sacroiliac joints are symmetric.  Lower thoracic posterior instrumentation. Included prevertebral and paraspinal soft tissue planes are nonsuspicious.  IMPRESSION: Degenerative change of the lumbar spine without acute fracture deformity or malalignment.   Electronically Signed   By: Elon Alas   On: 05/22/2013 00:48     No results found for this or any previous visit (from the past 720 hour(s)).   Impression/Recommendation  Principal Problem:   MRSA bacteremia Active Problems:   FUO (fever of unknown origin)   H/O spinal fusion   H/O Spinal surgery   Jonathan Blackburn is a 68  y.o. male with  FUO and 1/1 positive blood cx for VIridans Strep from 05/17/13 without a simultaneous culture having been performed Who has had FUO and weight loss since January of 2015  #1 1 blood culture with viridans strep:  I think this most likely is a contaminant given that is not growing on the cultures from the 10th nor on the admission blood cultures today. Therefore I will stop his antibiotics. I do thank to getting a transesophageal echocardiogram is a reasonable test given his history of cardiac murmur a fevers weight loss. I do not think that we can treat for endocarditis or even bacteremia with his isolated culture done on the eighth. Viridans group strep is a common contaminant in blood cultures. He has not grown it from the blood culture drawn on the 10th nor of the 2 sites cultured on his admission to [facility] on the 12.  #2 fever of unknown origin elevated a CT of the chest abdomen and pelvis as well as initiate labs for infectious disease as well as connective tissue diseases and malignancy with his morning blood draw. I spent greater than 45 minutes with the patient including greater than 50% of time in face to face counsel of the patient and in coordination of their care.   05/22/2013, 6:01 PM   Thank you so much for this interesting consult  [facility] [place] [phone] (pager) [phone] (office) 05/22/2013, 6:01 PM  [place] 05/22/2013, 6:01 PM

## 2013-05-23 NOTE — Progress Notes (Signed)
CRITICAL LAB RESULT:  Blood cultures (aerobic) show GRAM POSITIVE COCCI in chains  Result paged to [facility] MD on-call.  Will continue to monitor.  Randell Patient

## 2013-05-24 ENCOUNTER — Encounter (HOSPITAL_COMMUNITY): Payer: Self-pay | Admitting: Cardiology

## 2013-05-24 ENCOUNTER — Inpatient Hospital Stay (HOSPITAL_COMMUNITY): Payer: Medicare Other

## 2013-05-24 DIAGNOSIS — K047 Periapical abscess without sinus: Secondary | ICD-10-CM

## 2013-05-24 DIAGNOSIS — I330 Acute and subacute infective endocarditis: Principal | ICD-10-CM

## 2013-05-24 LAB — BASIC METABOLIC PANEL
BUN: 13 mg/dL (ref 6–23)
CHLORIDE: 104 meq/L (ref 96–112)
CO2: 21 meq/L (ref 19–32)
CREATININE: 0.69 mg/dL (ref 0.50–1.35)
Calcium: 9.3 mg/dL (ref 8.4–10.5)
GFR calc Af Amer: >90 mL/min (ref >90)
GFR calc non Af Amer: >90 mL/min (ref >90)
Glucose, Bld: 167 mg/dL — ABNORMAL HIGH (ref 70–99)
Potassium: 3.8 mEq/L (ref 3.7–5.3)
Sodium: 139 mEq/L (ref 137–147)

## 2013-05-24 LAB — EPSTEIN-BARR VIRUS VCA ANTIBODY PANEL
EBV EA IgG: 8.9 U/mL (ref <9.0)
EBV NA IgG: 302 U/mL — ABNORMAL HIGH (ref <18.0)
EBV VCA IgG: 289 U/mL — ABNORMAL HIGH (ref <18.0)
EBV VCA IgM: <10 U/mL (ref <36.0)

## 2013-05-24 LAB — MPO/PR-3 (ANCA) ANTIBODIES: Serine Protease 3: <1

## 2013-05-24 LAB — CBC
HEMATOCRIT: 38.4 % — AB (ref 39.0–52.0)
Hemoglobin: 12.9 g/dL — ABNORMAL LOW (ref 13.0–17.0)
MCH: 27.4 pg (ref 26.0–34.0)
MCHC: 33.6 g/dL (ref 30.0–36.0)
MCV: 81.7 fL (ref 78.0–100.0)
Platelets: 206 10*3/uL (ref 150–400)
RBC: 4.7 MIL/uL (ref 4.22–5.81)
RDW: 14.8 % (ref 11.5–15.5)
WBC: 7 10*3/uL (ref 4.0–10.5)

## 2013-05-24 LAB — QUANTIFERON TB GOLD ASSAY (BLOOD)
INTERFERON GAMMA RELEASE ASSAY: NEGATIVE
MITOGEN VALUE: 0.67 [IU]/mL
QUANTIFERON NIL VALUE: 0.06 [IU]/mL
TB AG VALUE: 0.06 [IU]/mL
TB ANTIGEN MINUS NIL VALUE: 0 [IU]/mL

## 2013-05-24 LAB — GLUCOSE, CAPILLARY
GLUCOSE-CAPILLARY: 160 mg/dL — AB (ref 70–99)
GLUCOSE-CAPILLARY: 129 mg/dL — AB (ref 70–99)
Glucose-Capillary: 145 mg/dL — ABNORMAL HIGH (ref 70–99)
Glucose-Capillary: 157 mg/dL — ABNORMAL HIGH (ref 70–99)

## 2013-05-24 LAB — ANA: Anti Nuclear Antibody(ANA): NEGATIVE

## 2013-05-24 NOTE — Progress Notes (Signed)
[facility] for Infectious Disease  Day #4 vancomycin  Subjective: Afebrile but felt like he had subjective fevers yesterday,  TEE showed mitral valve endocarditis. panogram showed possible tooth abscess   Medications: Scheduled Meds: . amLODipine  5 mg Oral Daily  . aspirin EC  81 mg Oral Daily  . enoxaparin (LOVENOX) injection  40 mg Subcutaneous Q24H  . ezetimibe  10 mg Oral q morning - 10a  . insulin aspart  0-5 Units Subcutaneous QHS  . insulin aspart  0-9 Units Subcutaneous TID WC  . lisinopril  20 mg Oral Daily  . loratadine  10 mg Oral Daily  . sodium chloride  3 mL Intravenous Q12H  . vancomycin  1,000 mg Intravenous Q12H    Objective: Weight change: -14.4 oz (-0.408 kg)  Intake/Output Summary (Last 24 hours) at 05/24/13 1016 Last data filed at 05/24/13 0853  Gross per 24 hour  Intake 1634.17 ml  Output   1050 ml  Net 584.17 ml   Blood pressure 126/77, pulse 81, temperature 98.6 F (37 C), temperature source Oral, resp. rate 19, height 5\' 7"  (1.702 m), weight 169 lb 3.2 oz (76.749 kg), SpO2 98.00%. Temp:  [98.2 F (36.8 C)-98.6 F (37 C)] 98.6 F (37 C) (04/15 0655) Pulse Rate:  [75-85] 81 (04/15 0950) Resp:  [13-21] 19 (04/15 0655) BP: (109-138)/(46-84) 126/77 mmHg (04/15 0950) SpO2:  [96 %-99 %] 98 % (04/15 0655) Weight:  [169 lb 3.2 oz (76.749 kg)] 169 lb 3.2 oz (76.749 kg) (04/15 0412)  Physical Exam: General: Alert and awake, oriented x3, not in any acute distress. CVS regular rate, II/vi systolic murmur Chest: clear to auscultation bilaterally, no wheezing, rales or rhonchi  Skin: no rashes     CBC:  Recent Labs Lab 05/21/13 1856 05/22/13 0602 05/23/13 0415 05/24/13 0310  HGB 14.2 12.6* 12.7* 12.9*  HCT 41.8 37.3* 38.0* 38.4*  PLT 222 187 202 206  INR  --  1.15  --   --      BMET  Recent Labs  05/23/13 0415 05/24/13 0310  NA 138  139 139  K 4.2  4.4 3.8  CL 104  104 104  CO2 20  19 21   GLUCOSE 148*  147* 167*    BUN 12  12 13   CREATININE 0.73  0.73 0.69  CALCIUM 9.0  9.0 9.3     Liver Panel   Recent Labs  05/21/13 1856 05/22/13 0602  PROT 7.3 6.2  ALBUMIN 3.6 3.0*  AST 23 15  ALT 21 16  ALKPHOS 73 60  BILITOT 0.5 0.6    Sedimentation Rate  Recent Labs  05/21/13 2304  ESRSEDRATE 24*   C-Reactive Protein  Recent Labs  05/21/13 2304 05/23/13 0415  CRP 4.7* 4.4*    Micro Results: Studies/Results: Dg Orthopantogram  05/24/2013   CLINICAL DATA:  Evaluate source of endocarditis  EXAM: ORTHOPANTOGRAM/PANORAMIC  COMPARISON:  None.  FINDINGS: There is a subtle periapical lucency involving the left lower first molar. Dental amalgam is identified involving the upper and lower teeth.  IMPRESSION: 1. Subtle periapical lucency involving the left lower first molar which may indicate abscess.   Electronically Signed   By: Kerby Moors M.D.   On: 05/24/2013 08:45   Ct Chest W Contrast  05/22/2013   CLINICAL DATA:  Two months of generalized weakness, weight and fever  EXAM: CT CHEST, ABDOMEN, AND PELVIS WITH CONTRAST  TECHNIQUE: Multidetector CT imaging of the chest, abdomen and pelvis was performed  following the standard protocol during bolus administration of intravenous contrast.  CONTRAST:  133mL OMNIPAQUE IOHEXOL 300 MG/ML  SOLN  COMPARISON:  DG THORACIC SPINE W/SWIMMERS dated 05/22/2013; CT T SPINE W/O CM dated 10/04/2012  FINDINGS: CT CHEST FINDINGS  No axillary or supraclavicular lymphadenopathy. No mediastinal hilar lymphadenopathy. Mild basilar atelectasis. No evidence of pulmonary infection. Airways are normal.  CT ABDOMEN AND PELVIS FINDINGS  No focal hepatic lesion. Small gallstone within the gallbladder. No evidence of gallbladder wall inflammation. The pancreas, spleen, adrenal glands are normal. There is a 10 mm calculus at the right ureteropelvic junction. Mild pelvicaliectasis on the right. Several small nonobstructing left renal calculi are noted. No ureterolithiasis evident.   Stomach, small bowel, and distal appendix are normal. The appendix does extend inferiorly into a right inguinal hernia. There is thickened tissue at the orifice of the appendix extending into the cecum measuring 18 mm x20 mm (image 95, series 2). The colon and rectosigmoid colon are otherwise normal.  Abdominal aorta is normal caliber. No retroperitoneal or periportal lymphadenopathy.  No free fluid the pelvis. The prostate gland and bladder normal. Bilateral fat filled inguinal hernias. No aggressive osseous lesion. Posterior thoracic fusion noted.  IMPRESSION: *Thickened tissue at the orifice of the appendix extending into the cecum. Recommend colonoscopy for evaluation and potential sampling to exclude appendiceal carcinoma. Alternatively this could relate to chronic inflammation. *Mid and distal appendix are normal with the tip extending into a right inguinal hernia. *Mildly obstructing calculus at the right ureteropelvic junction with pelvicaliectasis. *Bilateral nephrolithiasis. *Cholelithiasis without evidence of cholecystitis.   Electronically Signed   By: Suzy Bouchard M.D.   On: 05/22/2013 18:50   Ct Abdomen Pelvis W Contrast  05/22/2013   CLINICAL DATA:  Two months of generalized weakness, weight and fever  EXAM: CT CHEST, ABDOMEN, AND PELVIS WITH CONTRAST  TECHNIQUE: Multidetector CT imaging of the chest, abdomen and pelvis was performed following the standard protocol during bolus administration of intravenous contrast.  CONTRAST:  169mL OMNIPAQUE IOHEXOL 300 MG/ML  SOLN  COMPARISON:  DG THORACIC SPINE W/SWIMMERS dated 05/22/2013; CT T SPINE W/O CM dated 10/04/2012  FINDINGS: CT CHEST FINDINGS  No axillary or supraclavicular lymphadenopathy. No mediastinal hilar lymphadenopathy. Mild basilar atelectasis. No evidence of pulmonary infection. Airways are normal.  CT ABDOMEN AND PELVIS FINDINGS  No focal hepatic lesion. Small gallstone within the gallbladder. No evidence of gallbladder wall  inflammation. The pancreas, spleen, adrenal glands are normal. There is a 10 mm calculus at the right ureteropelvic junction. Mild pelvicaliectasis on the right. Several small nonobstructing left renal calculi are noted. No ureterolithiasis evident.  Stomach, small bowel, and distal appendix are normal. The appendix does extend inferiorly into a right inguinal hernia. There is thickened tissue at the orifice of the appendix extending into the cecum measuring 18 mm x20 mm (image 95, series 2). The colon and rectosigmoid colon are otherwise normal.  Abdominal aorta is normal caliber. No retroperitoneal or periportal lymphadenopathy.  No free fluid the pelvis. The prostate gland and bladder normal. Bilateral fat filled inguinal hernias. No aggressive osseous lesion. Posterior thoracic fusion noted.  IMPRESSION: *Thickened tissue at the orifice of the appendix extending into the cecum. Recommend colonoscopy for evaluation and potential sampling to exclude appendiceal carcinoma. Alternatively this could relate to chronic inflammation. *Mid and distal appendix are normal with the tip extending into a right inguinal hernia. *Mildly obstructing calculus at the right ureteropelvic junction with pelvicaliectasis. *Bilateral nephrolithiasis. *Cholelithiasis without evidence of cholecystitis.  Electronically Signed   By: Suzy Bouchard M.D.   On: 05/22/2013 18:50   Assessment/Plan:  Active Problems:   FUO (fever of unknown origin)   H/O spinal fusion   H/O Spinal surgery   Bacteremia   Jonathan Blackburn is a 68 y.o. 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 Consider getting dental consults to see if needs tooth extraction   #3 Thickening in cecum: can defer to outpatient thus far    LOS: 3 days   Carlyle Basques 05/24/2013, 10:16 AM

## 2013-05-24 NOTE — Progress Notes (Signed)
[facility] PROGRESS NOTE  Zakk Borgen MCN:[id number] DOB: 1945-05-30 DOA: 05/21/2013 PCP: Fae Pippin  Interim Summary Patient is a pleasant 68 year old gentleman with a past medical history of type 2 diabetes mellitus, hypertension, admitted to the medicine service on 05/21/2013. He has had fevers, chills, night sweats since the end of January associated with generalized weakness, malaise, fatigue, poor tolerance to physical exertion. He also endorsed a 20 pound weight loss during this time. He was treated for prostatitis by his primary care physician with Levaquin. Blood cultures were checked, growing Streptococcus, organism susceptible to vancomycin levofloxacin clindamycin chloramphenicol with intermediate susceptibility to ceftriaxone cefepime, resistant to Cefotaxime. Case discussed with infectious disease and with cardiology. Given high suspicion for infectious endocarditis he had a transesophageal echocardiogram that was performed on 05/23/2013 which showed a small 8 mm x 3 mm screw and like, a mobile vegetation on the atrial aspect of the body of the posterior leaflet. Findings suggestive of endocarditis. Meanwhile his blood cultures drawn on 05/21/2013 have grown gram-positive cocci in chains from both cultures. Infectious disease following, patient remains on IV vancomycin. He remains hemodynamically stable, afebrile, without clinical signs or symptoms of congestive heart failure.                                                                                               Assessment/Plan: 1. Endocarditis.  -Patient presenting with a 2 to 76-month history of generalized weakness, malaise, fevers, chills, night sweats, 20 pound weight loss.  -Blood cultures obtained at his primary care physician's office on 05/17/13 growing Viridans Streptococcus group.  -Blood culture report faxed to [facility], it appears organism is susceptible to vancomycin, levofloxacin clindamycin, with intermediate  susceptibility to erythromycin ceftriaxone and cefepime, resistant to penicillin and cefotaxime.  -Blood culture report left in paper chart.   -Transesophageal echocardiogram showing mobile vegetation on the atrial aspect of the body of the posterior leaflet -Repeat Blood Cultures drawn on 05/21/2013 growing gram-positive cocci in chains from both cultures  -Remains on IV vancomycin -Await further recs from ID -Hemodynamically stable, no evidence of CHF His pantogram was done and shows abscess.   2.  Type 2 diabetes mellitus - Blood sugars stable -Continue on Accu-Cheks with sliding scale coverage CBG (last 3)   Recent Labs  05/24/13 0653 05/24/13 1118 05/24/13 1612  GLUCAP 145* 160* 129*     3. Hypertension -Blood pressures improved, will restart amlodipine with holding parameters  4. Thickened Tissue on Appendix -CT scan of abdomen and pelvis showing thickened tissue at the orifice of the appendix extending up to the cecum. -Radiology report stating a recommendation for colonoscopy for evaluation and potential sampling to exclude appendiceal carcinoma. -GI consulted placed for further recommendations, spoke with Dr Collene Mares, awaiting recommendations.   5. DVT prophylaxis -Subcutaneous Lovenox   Code Status: Full Code Family Communication: I spoke with patient's significant other at bedside Disposition Plan: Continue IV antimicrobial therapy, ID following, GI consulted for CT scan findings   Consultants:  ID  Cardiology  Procedures:  Transesophageal echocardiogram performed on 05/23/2013  Antibiotics:  Vancomycin (started on 05/21/13)  HPI/Subjective: He says  he is bored and wants to know when he can go home.  Objective: Filed Vitals:   05/24/13 1433  BP: 130/69  Pulse: 87  Temp: 97.9 F (36.6 C)  Resp: 18    Intake/Output Summary (Last 24 hours) at 05/24/13 1645 Last data filed at 05/24/13 1433  Gross per 24 hour  Intake 1634.17 ml  Output   1050 ml   Net 584.17 ml   Filed Weights   05/22/13 0059 05/23/13 0500 05/24/13 0412  Weight: 77.4 kg (170 lb 10.2 oz) 77.157 kg (170 lb 1.6 oz) 76.749 kg (169 lb 3.2 oz)    Exam:   General:  Patient is in no acute distress, nontoxic appearing awake alert and oriented  Cardiovascular: Regular rate and rhythm normal S1-S2  Respiratory: Clear to auscultation bilaterally  Abdomen: No pain, positive bowel sounds  Musculoskeletal: No edema  Skin: Did not note rashes  Data Reviewed: Basic Metabolic Panel:  Recent Labs Lab 05/21/13 1856 05/22/13 0602 05/23/13 0415 05/24/13 0310  NA 141 138 138  139 139  K 4.0 3.8 4.2  4.4 3.8  CL 102 102 104  104 104  CO2 21 20 20  19 21   GLUCOSE 152* 130* 148*  147* 167*  BUN 28* 19 12  12 13   CREATININE 0.94 0.75 0.73  0.73 0.69  CALCIUM 9.6 8.9 9.0  9.0 9.3   Liver Function Tests:  Recent Labs Lab 05/21/13 1856 05/22/13 0602  AST 23 15  ALT 21 16  ALKPHOS 73 60  BILITOT 0.5 0.6  PROT 7.3 6.2  ALBUMIN 3.6 3.0*   No results found for this basename: LIPASE, AMYLASE,  in the last 168 hours No results found for this basename: AMMONIA,  in the last 168 hours CBC:  Recent Labs Lab 05/21/13 1856 05/22/13 0602 05/23/13 0415 05/24/13 0310  WBC 9.2 7.1 7.5 7.0  NEUTROABS 7.5  --   --   --   HGB 14.2 12.6* 12.7* 12.9*  HCT 41.8 37.3* 38.0* 38.4*  MCV 82.1 80.4 81.2 81.7  PLT 222 187 202 206   Cardiac Enzymes:  Recent Labs Lab 05/23/13 0415  CKTOTAL 38   BNP (last 3 results) No results found for this basename: PROBNP,  in the last 8760 hours CBG:  Recent Labs Lab 05/23/13 1640 05/23/13 2108 05/24/13 0653 05/24/13 1118 05/24/13 1612  GLUCAP 164* 127* 145* 160* 129*    Recent Results (from the past 240 hour(s))  CULTURE, BLOOD (ROUTINE X 2)     Status: None   Collection Time    05/21/13 10:27 PM      Result Value Ref Range Status   Specimen Description BLOOD LEFT ARM   Final   Special Requests BOTTLES DRAWN  AEROBIC AND ANAEROBIC [facility] EACH   Final   Culture  Setup Time     Final   Value: 05/22/2013 02:27     Performed at [facility]   Culture     Final   Value: STREPTOCOCCUS SPECIES     Note: Gram Stain Report Called to,Read Back By and Verified With: MARIA LESTER AT 0608 05/23/13 BY SNOLO     Performed at [facility]   Report Status PENDING   Incomplete  CULTURE, BLOOD (ROUTINE X 2)     Status: None   Collection Time    05/21/13 10:30 PM      Result Value Ref Range Status   Specimen Description BLOOD RIGHT ARM   Final  Special Requests BOTTLES DRAWN AEROBIC ONLY 5CC   Final   Culture  Setup Time     Final   Value: 05/22/2013 02:27     Performed at [facility]   Culture     Final   Value: STREPTOCOCCUS SPECIES     Note: Gram Stain Report Called to,Read Back By and Verified With: MARIA LESTER AT 8:07 P.M. ON 05/22/13 WARRB     Performed at [facility]   Report Status PENDING   Incomplete     Studies: Dg Orthopantogram  05/24/2013   CLINICAL DATA:  Evaluate source of endocarditis  EXAM: ORTHOPANTOGRAM/PANORAMIC  COMPARISON:  None.  FINDINGS: There is a subtle periapical lucency involving the left lower first molar. Dental amalgam is identified involving the upper and lower teeth.  IMPRESSION: 1. Subtle periapical lucency involving the left lower first molar which may indicate abscess.   Electronically Signed   By: Kerby Moors M.D.   On: 05/24/2013 08:45   Ct Chest W Contrast  05/22/2013   CLINICAL DATA:  Two months of generalized weakness, weight and fever  EXAM: CT CHEST, ABDOMEN, AND PELVIS WITH CONTRAST  TECHNIQUE: Multidetector CT imaging of the chest, abdomen and pelvis was performed following the standard protocol during bolus administration of intravenous contrast.  CONTRAST:  151mL OMNIPAQUE IOHEXOL 300 MG/ML  SOLN  COMPARISON:  DG THORACIC SPINE W/SWIMMERS dated 05/22/2013; CT T SPINE W/O CM dated 10/04/2012  FINDINGS: CT CHEST FINDINGS  No axillary  or supraclavicular lymphadenopathy. No mediastinal hilar lymphadenopathy. Mild basilar atelectasis. No evidence of pulmonary infection. Airways are normal.  CT ABDOMEN AND PELVIS FINDINGS  No focal hepatic lesion. Small gallstone within the gallbladder. No evidence of gallbladder wall inflammation. The pancreas, spleen, adrenal glands are normal. There is a 10 mm calculus at the right ureteropelvic junction. Mild pelvicaliectasis on the right. Several small nonobstructing left renal calculi are noted. No ureterolithiasis evident.  Stomach, small bowel, and distal appendix are normal. The appendix does extend inferiorly into a right inguinal hernia. There is thickened tissue at the orifice of the appendix extending into the cecum measuring 18 mm x20 mm (image 95, series 2). The colon and rectosigmoid colon are otherwise normal.  Abdominal aorta is normal caliber. No retroperitoneal or periportal lymphadenopathy.  No free fluid the pelvis. The prostate gland and bladder normal. Bilateral fat filled inguinal hernias. No aggressive osseous lesion. Posterior thoracic fusion noted.  IMPRESSION: *Thickened tissue at the orifice of the appendix extending into the cecum. Recommend colonoscopy for evaluation and potential sampling to exclude appendiceal carcinoma. Alternatively this could relate to chronic inflammation. *Mid and distal appendix are normal with the tip extending into a right inguinal hernia. *Mildly obstructing calculus at the right ureteropelvic junction with pelvicaliectasis. *Bilateral nephrolithiasis. *Cholelithiasis without evidence of cholecystitis.   Electronically Signed   By: Suzy Bouchard M.D.   On: 05/22/2013 18:50   Ct Abdomen Pelvis W Contrast  05/22/2013   CLINICAL DATA:  Two months of generalized weakness, weight and fever  EXAM: CT CHEST, ABDOMEN, AND PELVIS WITH CONTRAST  TECHNIQUE: Multidetector CT imaging of the chest, abdomen and pelvis was performed following the standard protocol  during bolus administration of intravenous contrast.  CONTRAST:  148mL OMNIPAQUE IOHEXOL 300 MG/ML  SOLN  COMPARISON:  DG THORACIC SPINE W/SWIMMERS dated 05/22/2013; CT T SPINE W/O CM dated 10/04/2012  FINDINGS: CT CHEST FINDINGS  No axillary or supraclavicular lymphadenopathy. No mediastinal hilar lymphadenopathy. Mild basilar atelectasis. No evidence of pulmonary  infection. Airways are normal.  CT ABDOMEN AND PELVIS FINDINGS  No focal hepatic lesion. Small gallstone within the gallbladder. No evidence of gallbladder wall inflammation. The pancreas, spleen, adrenal glands are normal. There is a 10 mm calculus at the right ureteropelvic junction. Mild pelvicaliectasis on the right. Several small nonobstructing left renal calculi are noted. No ureterolithiasis evident.  Stomach, small bowel, and distal appendix are normal. The appendix does extend inferiorly into a right inguinal hernia. There is thickened tissue at the orifice of the appendix extending into the cecum measuring 18 mm x20 mm (image 95, series 2). The colon and rectosigmoid colon are otherwise normal.  Abdominal aorta is normal caliber. No retroperitoneal or periportal lymphadenopathy.  No free fluid the pelvis. The prostate gland and bladder normal. Bilateral fat filled inguinal hernias. No aggressive osseous lesion. Posterior thoracic fusion noted.  IMPRESSION: *Thickened tissue at the orifice of the appendix extending into the cecum. Recommend colonoscopy for evaluation and potential sampling to exclude appendiceal carcinoma. Alternatively this could relate to chronic inflammation. *Mid and distal appendix are normal with the tip extending into a right inguinal hernia. *Mildly obstructing calculus at the right ureteropelvic junction with pelvicaliectasis. *Bilateral nephrolithiasis. *Cholelithiasis without evidence of cholecystitis.   Electronically Signed   By: Suzy Bouchard M.D.   On: 05/22/2013 18:50    Scheduled Meds: . amLODipine  5 mg  Oral Daily  . aspirin EC  81 mg Oral Daily  . enoxaparin (LOVENOX) injection  40 mg Subcutaneous Q24H  . ezetimibe  10 mg Oral q morning - 10a  . insulin aspart  0-5 Units Subcutaneous QHS  . insulin aspart  0-9 Units Subcutaneous TID WC  . lisinopril  20 mg Oral Daily  . loratadine  10 mg Oral Daily  . sodium chloride  3 mL Intravenous Q12H  . vancomycin  1,000 mg Intravenous Q12H   Continuous Infusions: . sodium chloride 75 mL/hr at 05/23/13 1722    Active Problems:   FUO (fever of unknown origin)   H/O spinal fusion   H/O Spinal surgery   Bacteremia    Time spent: 40 minutes    Hosie Poisson  [facility] Pager [phone]. If 7PM-7AM, please contact night-coverage at [email or web address], password [facility] 05/24/2013, 4:45 PM  LOS: 3 days

## 2013-05-24 NOTE — Progress Notes (Signed)
ANTIBIOTIC CONSULT NOTE   Pharmacy Consult for Vancomycin Indication: strep endocarditis  No Known Allergies  Patient Measurements: Height: 5\' 7"  (170.2 cm) Weight: 169 lb 3.2 oz (76.749 kg) IBW/kg (Calculated) : 66.1  Labs:  Recent Labs  05/22/13 0602 05/23/13 0415 05/24/13 0310  WBC 7.1 7.5 7.0  HGB 12.6* 12.7* 12.9*  PLT 187 202 206  CREATININE 0.75 0.73  0.73 0.69   Estimated Creatinine Clearance: 83.8 ml/min (by C-G formula based on Cr of 0.69). No results found for this basename: VANCOTROUGH, Corlis Leak, VANCORANDOM, GENTTROUGH, GENTPEAK, GENTRANDOM, TOBRATROUGH, TOBRAPEAK, TOBRARND, AMIKACINPEAK, AMIKACINTROU, AMIKACIN,  in the last 72 hours   Microbiology: Recent Results (from the past 720 hour(s))  CULTURE, BLOOD (ROUTINE X 2)     Status: None   Collection Time    05/21/13 10:27 PM      Result Value Ref Range Status   Specimen Description BLOOD LEFT ARM   Final   Special Requests BOTTLES DRAWN AEROBIC AND ANAEROBIC [facility] EACH   Final   Culture  Setup Time     Final   Value: 05/22/2013 02:27     Performed at [facility]   Culture     Final   Value: STREPTOCOCCUS SPECIES     Note: Gram Stain Report Called to,Read Back By and Verified With: MARIA LESTER AT 0608 05/23/13 BY SNOLO     Performed at [facility]   Report Status PENDING   Incomplete  CULTURE, BLOOD (ROUTINE X 2)     Status: None   Collection Time    05/21/13 10:30 PM      Result Value Ref Range Status   Specimen Description BLOOD RIGHT ARM   Final   Special Requests BOTTLES DRAWN AEROBIC ONLY 5CC   Final   Culture  Setup Time     Final   Value: 05/22/2013 02:27     Performed at [facility]   Culture     Final   Value: STREPTOCOCCUS SPECIES     Note: Gram Stain Report Called to,Read Back By and Verified With: MARIA LESTER AT 8:07 P.M. 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   Day 4 of vancomycin therapy Plan is for high dose PCN / gentamicin once final culture data is received  Scr stable  Goal of Therapy:  Vancomycin trough level 15-20 mcg/ml  Plan:  1) Continue vancomycin 1 gram iv Q 12 hours 2) Follow up vancomycin trough as needed (awaiting plan) 3) Follow cultures  Thank you. Anette Guarneri, PharmD [phone]  05/24/2013 10:27 AM

## 2013-05-24 NOTE — Consult Note (Signed)
Unassigned patient Reason for Consult: Abnormal CT scan-Endocarditis diagnosed on admission. Referring Physician: Dr. Jacobo Forest.  Jonathan Blackburn is an 68 y.o. male.  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 Marlou Porch on 05/23/13. He was diagnosed with positive blood cultures done on 04/08./15 when he was found to have Strep. Viridans. He is currently receiving Vancomycin. He had a panorex done that revealed a possible abscess below the first left molar. The patient has had problems with constipation since his back surgery last year as he has used narcotics off and on. He can go 2-3 days without a BM. He denies having any melena or hematochezia. He claims has had a colonoscopy about 3 years ago by his gastroenterologist at the [facility] in [place] about 3 years ago. He reportedly had polyps removed and was advised to have a follow up surveillance colonoscopy in 5 years. He reports having had 2 other colonoscopies prior to the last one, when he had polyps removed. He however, does not want to return to hid gastroenterologist in [place] and wants to receive all his care here in [place]. He denies having any problems with reflux, dysphagia, odynophagia, nausea or vomiting.     Past Medical History  Diagnosis Date  . Diabetes mellitus without complication   . Hypertension   . Heart murmur   . Arthritis    Past Surgical History  Procedure Laterality Date  . Back surgery    . Shoulder arthroscopy      rotator cuff  . Posterior lumbar fusion 4 level N/A 11/16/2012    Procedure: Thoracic seven to thoracic eleven posterior lateral thoracic arthrodesis with  instrumentation and bone graft ;  Surgeon: Ophelia Charter, MD;  Location: [facility];  Service: Neurosurgery;  Laterality: N/A;  Thoracic seven to thoracic eleven posterior lateral thoracic arthrodesis with instrumentation and bone graft  . Tee without cardioversion N/A 05/23/2013    Procedure: TRANSESOPHAGEAL ECHOCARDIOGRAM (TEE);  Surgeon: Candee Furbish, MD;  Location: [facility] ENDOSCOPY;  Service: Cardiovascular;  Laterality: N/A;   History reviewed. No pertinent family history.  Social History:  reports that he has never smoked. He does not have any smokeless tobacco history on file. He reports that he drinks alcohol. He reports that he does not use illicit drugs.  Allergies: No Known Allergies  Medications: I have reviewed the patient's current medications.  Results for orders placed during the hospital encounter of 05/21/13 (from the past 48 hour(s))  GLUCOSE, CAPILLARY     Status: Abnormal   Collection Time    05/22/13 11:01 PM      Result Value Ref Range   Glucose-Capillary 137 (*) 70 - 99 mg/dL  CBC     Status: Abnormal   Collection Time    05/23/13  4:15 AM      Result Value Ref Range   WBC 7.5  4.0 - 10.5 K/uL   RBC 4.68  4.22 - 5.81 MIL/uL   Hemoglobin 12.7 (*) 13.0 - 17.0 g/dL   HCT 38.0 (*) 39.0 - 52.0 %   MCV 81.2  78.0 - 100.0 fL   MCH 27.1  26.0 - 34.0 pg   MCHC 33.4  30.0 - 36.0 g/dL   RDW 15.0  11.5 - 15.5 %   Platelets 202  150 - 400 K/uL  BASIC METABOLIC PANEL     Status: Abnormal   Collection Time    05/23/13  4:15 AM      Result Value Ref Range   Sodium 138  137 - 147 mEq/L   Potassium 4.2  3.7 - 5.3 mEq/L   Chloride 104  96 - 112 mEq/L   CO2 20  19 - 32 mEq/L   Glucose, Bld 148 (*) 70 - 99 mg/dL   BUN 12  6 - 23 mg/dL   Creatinine, Ser 0.73  0.50 - 1.35 mg/dL   Calcium 9.0  8.4 - 10.5 mg/dL   GFR calc non Af Amer >90  >90 mL/min   GFR calc Af Amer >90  >90 mL/min   Comment: (NOTE)     The eGFR has been calculated using the CKD EPI equation.     This  calculation has not been validated in all clinical situations.     eGFR's persistently <90 mL/min signify possible Chronic Kidney     Disease.  HEPATITIS PANEL, ACUTE     Status: None   Collection Time    05/23/13  4:15 AM      Result Value Ref Range   Hepatitis B Surface Ag NEGATIVE  NEGATIVE   HCV Ab NEGATIVE  NEGATIVE   Hep A IgM NON REACTIVE  NON REACTIVE   Hep B C IgM NON REACTIVE  NON REACTIVE   Comment: (NOTE)     High levels of Hepatitis B Core IgM antibody are detectable     during the acute stage of Hepatitis B. This antibody is used     to differentiate current from past HBV infection.     Performed at [facility]  HIV ANTIBODY (ROUTINE TESTING)     Status: None   Collection Time    05/23/13  4:15 AM      Result Value Ref Range   HIV 1&2 Ab, 4th Generation NONREACTIVE  NONREACTIVE   Comment: (NOTE)     A NONREACTIVE HIV Ag/Ab result does not exclude HIV infection since     the time frame for seroconversion is variable. If acute HIV infection     is suspected, a HIV-1 RNA Qualitative TMA test is recommended.     HIV-1/2 Antibody Diff         Not indicated.     HIV-1 RNA, Qual TMA           Not indicated.     PLEASE NOTE: This information has been disclosed to you from records     whose confidentiality may be protected by state law. If your state     requires such protection, then the state law prohibits you from making     any further disclosure of the information without the specific written     consent of the person to whom it pertains, or as otherwise permitted     by law. A general authorization for the release of medical or other     information is NOT sufficient for this purpose.     The performance of this assay has not been clinically validated in     patients less than 55 years old.     Performed at [facility]  ANA     Status: None   Collection Time    05/23/13  4:15 AM  Result Value Ref Range   ANA NEGATIVE  NEGATIVE   Comment:  Performed at [facility] DEHYDROGENASE     Status: Abnormal   Collection Time    05/23/13  4:15 AM      Result Value Ref Range   LDH 262 (*) 94 - 250 U/L  RHEUMATOID FACTOR     Status: None   Collection Time    05/23/13  4:15 AM      Result Value Ref Range   Rheumatoid Factor 11  <=14 IU/mL   Comment: (NOTE)                             Interpretive Table                        Low Positive: 15 - 41 IU/mL                        High Positive:  >= 42 IU/mL     In addition to the RF result, and clinical symptoms including joint     involvement, the 2010 ACR Classification Criteria for     scoring/diagnosing Rheumatoid Arthritis include the results of the     following tests:  CRP (62831), ESR (15010), and CCP (APCA) (51761).     www.rheumatology.org/practice/clinical/classification/ra/ra_2010.asp     Performed at [facility]  CK     Status: None   Collection Time    05/23/13  4:15 AM      Result Value Ref Range   Total CK 38  7 - 232 U/L  FERRITIN     Status: Abnormal   Collection Time    05/23/13  4:15 AM      Result Value Ref Range   Ferritin 410 (*) 22 - 322 ng/mL   Comment: Performed at [facility]  RPR     Status: None   Collection Time    05/23/13  4:15 AM      Result Value Ref Range   RPR NON REAC  NON REAC   Comment: Performed at [facility]  MPO/PR-3 (ANCA) ANTIBODIES     Status: None   Collection Time    05/23/13  4:15 AM      Result Value Ref Range   Myeloperoxidase Abs <1.0  <1.0 AI   Comment: (NOTE)                                  Value   Interpretation                                 <1.0 AI: No Antibody Detected                              >or=1.0 AI: Antibody Detected     Autoantibodies to myeloperoxidase (MPO) are commonly     associated with the following small-vessel     vasculitides: microscopic polyangiitis,     polyarteritis nodosa, Churg-Strauss syndrome,     necrotizing and crescentic  glomerulonephritis and     occasionally Wegener's granulomatosis. The perinuclear     IFA pattern, (p-ANCA), is based largely on autoantibody     to myeloperoxidase  which serves as the primary antigen     These autoantibodies are present in active disease     state.   Serine Protease 3 <1.0  <1.0 AI   Comment: (NOTE)                                  Value   Interpretation                                 <1.0 AI: No Antibody Detected                              >or=1.0 AI: Antibody Detected     Autoantibodies to proteinase-3 (PR-3) are accepted as     characteristic for granulomatosis with polyangiitis     (Wegener's), and are detectable in 95% of the     histologically proven cases. The cytoplasmic IFA     pattern, (c-ANCA), is based largely on autoantibody to     PR-3 which serves as the primary antigen.     These autoantibodies are present in active disease     state.     Performed at [facility] VCA ANTIBODY PANEL     Status: Abnormal   Collection Time    05/23/13  4:15 AM      Result Value Ref Range   EBV VCA IgG 289.0 (*) <18.0 U/mL   Comment: (NOTE)     Reference Range:       <18.0 U/mL = Negative                       18.0-21.9 U/mL = Equivocal                          >=22.0 U/mL = Positive   EBV VCA IgM <10.0  <36.0 U/mL   Comment: (NOTE)     Reference Range:       <36.0 U/mL = Negative                       36.0-43.9 U/mL = Equivocal                          >=44.0 U/mL = Positive   EBV NA IgG 302.0 (*) <18.0 U/mL   Comment: (NOTE)     Reference Range:       <18.0 U/mL = Negative                       18.0-21.9 U/mL = Equivocal                          >=22.0 U/mL = Positive           Clinical Stage            VCA IgG   VCA IgM      EA    EBV NA           Susceptibility               -         -         -       -  Very Early Infection        +/-       +/-        -       -           Established Infection        +         +         +/-      -           Recent Infection             +         +        +/-     +/-           Past Infection               +         -        +/-      +                                                                                     +/- means positive or negative (not weak)     High persisting antibody levels may be present in Burkitt's lymphoma     and nasopharyngeal carcinoma.   EBV EA IgG 8.9  <9.0 U/mL   Comment: (NOTE)     Reference Range:        <9.0 U/mL = Negative                        9.0-10.9 U/mL = Equivocal                          >=11.0 U/mL = Positive     Assay cross-reactivity for Early Antigen (EA) has been noted with some     specimens containing antibody to Human Immunodeficiency Virus (HIV).     HIV disease must be excluded before confirmation of EBV diagnosis.       Performed at [facility]     Status: Abnormal   Collection Time    05/23/13  4:15 AM      Result Value Ref Range   CRP 4.4 (*) <0.60 mg/dL   Comment: Performed at [facility] TB GOLD ASSAY     Status: None   Collection Time    05/23/13  4:15 AM      Result Value Ref Range   Interferon Gamma Release Assay NEGATIVE  NEGATIVE   Comment:     TB Ag value 0.06     Quantiferon Nil Value 0.06     Mitogen value 0.67     TB Antigen Minus Nil Value 0.00     Comment: (NOTE)     A positive result is indicated by a TB Ag value minus Nil value     greater than or equal to 0.35 IU/mL and the TB Ag value minus Nil     value must be greater than or equal to 25% of the  Nil value. There may     be insufficient information in these values to differentiate between     some negative and some indeterminate test values.     The QuantiFERON TB Gold assay is intended for use as an aid in the      diagnosis of TB infection. Negative results suggest that there is no     TB infection. In patients with high suspicion of exposure, a negative     test should be repeated.  A positive test can support the diagnosis of     infection with Mycobacterium tuberculosis. Among individuals without     tuberculosis infection, a positive test may be due to exposure to [place], M. szulgai or M. marinum.     The performance of the FDA approved QuantiFERON(R)-TB Gold test has     not been extensively evaluated with specimens from the following     groups of individuals:     A. Individuals younger than 67 years of age.     B. Pregnant women.     C. Individuals who have impaired or altered immune function such as       those who have HIV infection or AIDS, those who have        transplantation managed with immunosuppressive drugs (e.g.          corticosteroids, methotrexate, azathioprine, cancer chemotherapy),       and those who have other clinical conditions: diabetes, silicosis,       chronic renal failure, hematological disorders (e.g., leukemia and       lymphomas), and other specific malignancies (e.g., carcinoma of        the head and neck or lung).                Performed at [facility]     Status: Abnormal   Collection Time    05/23/13  4:15 AM      Result Value Ref Range   Sodium 139  137 - 147 mEq/L   Potassium 4.4  3.7 - 5.3 mEq/L   Chloride 104  96 - 112 mEq/L   CO2 19  19 - 32 mEq/L   Glucose, Bld 147 (*) 70 - 99 mg/dL   BUN 12  6 - 23 mg/dL   Creatinine, Ser 0.73  0.50 - 1.35 mg/dL   Calcium 9.0  8.4 - 10.5 mg/dL   GFR calc non Af Amer >90  >90 mL/min   GFR calc Af Amer >90  >90 mL/min   Comment: (NOTE)     The eGFR has been calculated using the CKD EPI equation.     This calculation has not been validated in all clinical situations.     eGFR's persistently <90 mL/min signify possible Chronic Kidney     Disease.  GLUCOSE, CAPILLARY     Status: Abnormal   Collection Time    05/23/13  6:14 AM      Result Value Ref Range   Glucose-Capillary 164 (*) 70 - 99 mg/dL  GLUCOSE, CAPILLARY     Status: Abnormal    Collection Time    05/23/13 12:09 PM      Result Value Ref Range   Glucose-Capillary 121 (*) 70 - 99 mg/dL  GLUCOSE, CAPILLARY     Status: Abnormal   Collection Time    05/23/13  4:40 PM      Result Value Ref Range   Glucose-Capillary  164 (*) 70 - 99 mg/dL  GLUCOSE, CAPILLARY     Status: Abnormal   Collection Time    05/23/13  9:08 PM      Result Value Ref Range   Glucose-Capillary 127 (*) 70 - 99 mg/dL  BASIC METABOLIC PANEL     Status: Abnormal   Collection Time    05/24/13  3:10 AM      Result Value Ref Range   Sodium 139  137 - 147 mEq/L   Potassium 3.8  3.7 - 5.3 mEq/L   Chloride 104  96 - 112 mEq/L   CO2 21  19 - 32 mEq/L   Glucose, Bld 167 (*) 70 - 99 mg/dL   BUN 13  6 - 23 mg/dL   Creatinine, Ser 0.69  0.50 - 1.35 mg/dL   Calcium 9.3  8.4 - 10.5 mg/dL   GFR calc non Af Amer >90  >90 mL/min   GFR calc Af Amer >90  >90 mL/min   Comment: (NOTE)     The eGFR has been calculated using the CKD EPI equation.     This calculation has not been validated in all clinical situations.     eGFR's persistently <90 mL/min signify possible Chronic Kidney     Disease.  CBC     Status: Abnormal   Collection Time    05/24/13  3:10 AM      Result Value Ref Range   WBC 7.0  4.0 - 10.5 K/uL   RBC 4.70  4.22 - 5.81 MIL/uL   Hemoglobin 12.9 (*) 13.0 - 17.0 g/dL   HCT 38.4 (*) 39.0 - 52.0 %   MCV 81.7  78.0 - 100.0 fL   MCH 27.4  26.0 - 34.0 pg   MCHC 33.6  30.0 - 36.0 g/dL   RDW 14.8  11.5 - 15.5 %   Platelets 206  150 - 400 K/uL  GLUCOSE, CAPILLARY     Status: Abnormal   Collection Time    05/24/13  6:53 AM      Result Value Ref Range   Glucose-Capillary 145 (*) 70 - 99 mg/dL  GLUCOSE, CAPILLARY     Status: Abnormal   Collection Time    05/24/13 11:18 AM      Result Value Ref Range   Glucose-Capillary 160 (*) 70 - 99 mg/dL    Dg Orthopantogram  05/24/2013   CLINICAL DATA:  Evaluate source of endocarditis  EXAM: ORTHOPANTOGRAM/PANORAMIC  COMPARISON:  None.  FINDINGS: There  is a subtle periapical lucency involving the left lower first molar. Dental amalgam is identified involving the upper and lower teeth.  IMPRESSION: 1. Subtle periapical lucency involving the left lower first molar which may indicate abscess.   Electronically Signed   By: Kerby Moors M.D.   On: 05/24/2013 08:45   Ct Chest W Contrast  05/22/2013   CLINICAL DATA:  Two months of generalized weakness, weight and fever  EXAM: CT CHEST, ABDOMEN, AND PELVIS WITH CONTRAST  TECHNIQUE: Multidetector CT imaging of the chest, abdomen and pelvis was performed following the standard protocol during bolus administration of intravenous contrast.  CONTRAST:  175m OMNIPAQUE IOHEXOL 300 MG/ML  SOLN  COMPARISON:  DG THORACIC SPINE W/SWIMMERS dated 05/22/2013; CT T SPINE W/O CM dated 10/04/2012  FINDINGS: CT CHEST FINDINGS  No axillary or supraclavicular lymphadenopathy. No mediastinal hilar lymphadenopathy. Mild basilar atelectasis. No evidence of pulmonary infection. Airways are normal.  CT ABDOMEN AND PELVIS FINDINGS  No focal hepatic lesion. Small gallstone  within the gallbladder. No evidence of gallbladder wall inflammation. The pancreas, spleen, adrenal glands are normal. There is a 10 mm calculus at the right ureteropelvic junction. Mild pelvicaliectasis on the right. Several small nonobstructing left renal calculi are noted. No ureterolithiasis evident.  Stomach, small bowel, and distal appendix are normal. The appendix does extend inferiorly into a right inguinal hernia. There is thickened tissue at the orifice of the appendix extending into the cecum measuring 18 mm x20 mm (image 95, series 2). The colon and rectosigmoid colon are otherwise normal.  Abdominal aorta is normal caliber. No retroperitoneal or periportal lymphadenopathy.  No free fluid the pelvis. The prostate gland and bladder normal. Bilateral fat filled inguinal hernias. No aggressive osseous lesion. Posterior thoracic fusion noted.  IMPRESSION: *Thickened  tissue at the orifice of the appendix extending into the cecum. Recommend colonoscopy for evaluation and potential sampling to exclude appendiceal carcinoma. Alternatively this could relate to chronic inflammation. *Mid and distal appendix are normal with the tip extending into a right inguinal hernia. *Mildly obstructing calculus at the right ureteropelvic junction with pelvicaliectasis. *Bilateral nephrolithiasis. *Cholelithiasis without evidence of cholecystitis.   Electronically Signed   By: Suzy Bouchard M.D.   On: 05/22/2013 18:50   Ct Abdomen Pelvis W Contrast  05/22/2013   CLINICAL DATA:  Two months of generalized weakness, weight and fever  EXAM: CT CHEST, ABDOMEN, AND PELVIS WITH CONTRAST  TECHNIQUE: Multidetector CT imaging of the chest, abdomen and pelvis was performed following the standard protocol during bolus administration of intravenous contrast.  CONTRAST:  152m OMNIPAQUE IOHEXOL 300 MG/ML  SOLN  COMPARISON:  DG THORACIC SPINE W/SWIMMERS dated 05/22/2013; CT T SPINE W/O CM dated 10/04/2012  FINDINGS: CT CHEST FINDINGS  No axillary or supraclavicular lymphadenopathy. No mediastinal hilar lymphadenopathy. Mild basilar atelectasis. No evidence of pulmonary infection. Airways are normal.  CT ABDOMEN AND PELVIS FINDINGS  No focal hepatic lesion. Small gallstone within the gallbladder. No evidence of gallbladder wall inflammation. The pancreas, spleen, adrenal glands are normal. There is a 10 mm calculus at the right ureteropelvic junction. Mild pelvicaliectasis on the right. Several small nonobstructing left renal calculi are noted. No ureterolithiasis evident.  Stomach, small bowel, and distal appendix are normal. The appendix does extend inferiorly into a right inguinal hernia. There is thickened tissue at the orifice of the appendix extending into the cecum measuring 18 mm x20 mm (image 95, series 2). The colon and rectosigmoid colon are otherwise normal.  Abdominal aorta is normal caliber. No  retroperitoneal or periportal lymphadenopathy.  No free fluid the pelvis. The prostate gland and bladder normal. Bilateral fat filled inguinal hernias. No aggressive osseous lesion. Posterior thoracic fusion noted.  IMPRESSION: *Thickened tissue at the orifice of the appendix extending into the cecum. Recommend colonoscopy for evaluation and potential sampling to exclude appendiceal carcinoma. Alternatively this could relate to chronic inflammation. *Mid and distal appendix are normal with the tip extending into a right inguinal hernia. *Mildly obstructing calculus at the right ureteropelvic junction with pelvicaliectasis. *Bilateral nephrolithiasis. *Cholelithiasis without evidence of cholecystitis.   Electronically Signed   By: SSuzy BouchardM.D.   On: 05/22/2013 18:50   Review of Systems  Constitutional: Positive for fever, weight loss, malaise/fatigue and diaphoresis.  HENT: Negative.   Eyes: Negative.   Respiratory: Negative.   Cardiovascular: Negative.   Gastrointestinal: Positive for nausea and constipation. Negative for heartburn, vomiting, abdominal pain and blood in stool.  Genitourinary: Negative.   Musculoskeletal: Positive for back pain. Negative for falls,  joint pain, myalgias and neck pain.  Skin: Negative.   Neurological: Positive for weakness.  Endo/Heme/Allergies: Negative.   Psychiatric/Behavioral: Negative.    Blood pressure 130/69, pulse 87, temperature 97.9 F (36.6 C), temperature source Oral, resp. rate 18, height _0  (1.702 m), weight 76.749 kg (169 lb 3.2 oz), SpO2 99.00%. Physical Exam  Constitutional: He is oriented to person, place, and time. He appears well-developed and well-nourished.  HENT:  Head: Normocephalic and atraumatic.  Eyes: Conjunctivae and EOM are normal. Pupils are equal, round, and reactive to light.  Neck: Normal range of motion. Neck supple.  Cardiovascular: Normal rate and regular rhythm.   Respiratory: Effort normal and breath sounds  normal.  GI: Soft. Bowel sounds are normal.  Musculoskeletal: Normal range of motion.  Neurological: He is alert and oriented to person, place, and time.  Skin: Skin is warm and dry.  Psychiatric: He has a normal mood and affect. His behavior is normal. Judgment and thought content normal.   Assessment/Plan: 1) Infectious endocarditis with Strep. Viridans bacteremia and an abnormal CT showing some thickening at the level of the appendiceal orifice-as per my discussion with the patient and his wide, we can wait to do a colonoscopy once his acute symptoms resolve. He has had regular screening colonoscopies but he will need another exam in light of his recent diagnosis.  2) Drug induced constipation.  3) History of colonic polyps. 4) Cholelithiasis without acute cholecystitis on CT.  5) Bilateral nephrolithiasis.  6) Diabetes mellitus-on SSC.  7) ?Dental abscess on panorex-will need a dentist to see him ASAP.  8) S/P Lumbar fusion in 2014. Juanita Craver 05/24/2013, 4:12 PM

## 2013-05-25 ENCOUNTER — Encounter (HOSPITAL_COMMUNITY): Payer: Self-pay | Admitting: Dentistry

## 2013-05-25 DIAGNOSIS — I330 Acute and subacute infective endocarditis: Secondary | ICD-10-CM

## 2013-05-25 LAB — CULTURE, BLOOD (ROUTINE X 2)

## 2013-05-25 LAB — GLUCOSE, CAPILLARY
Glucose-Capillary: 142 mg/dL — ABNORMAL HIGH (ref 70–99)
Glucose-Capillary: 121 mg/dL — ABNORMAL HIGH (ref 70–99)
Glucose-Capillary: 169 mg/dL — ABNORMAL HIGH (ref 70–99)
Glucose-Capillary: 133 mg/dL — ABNORMAL HIGH (ref 70–99)

## 2013-05-25 LAB — CMV IGM

## 2013-05-25 LAB — CMV ANTIBODY, IGG (EIA)

## 2013-05-25 MED ORDER — PENICILLIN G POTASSIUM 20000000 UNITS IJ SOLR
9.0000 10*6.[IU] | Freq: Two times a day (BID) | INTRAVENOUS | Status: DC
  Administered 2013-05-25 – 2013-05-26 (×2): 9 10*6.[IU] via INTRAVENOUS
  Filled 2013-05-25 (×4): qty 9

## 2013-05-25 NOTE — Progress Notes (Signed)
I called the physician's office in [place], Dr [facility] @ [phone] and confirmed that Mr. Davenport did not have blood cultures positive for MRSA. I have removed the MRSA flag. I called [facility] and spoke with Leeroy Cha, RN. Dr Lucianne Lei dam had already removed the patient from Contact precautions.

## 2013-05-25 NOTE — Progress Notes (Signed)
[facility] PROGRESS NOTE  Jonathan Blackburn RSW:[id number] DOB: 30-Aug-1945 DOA: 05/21/2013 PCP: Fae Pippin  Interim Summary Patient is a pleasant 68 year old gentleman with a past medical history of type 2 diabetes mellitus, hypertension, admitted to the medicine service on 05/21/2013. He has had fevers, chills, night sweats since the end of January associated with generalized weakness, malaise, fatigue, poor tolerance to physical exertion. He also endorsed a 20 pound weight loss during this time. He was treated for prostatitis by his primary care physician with Levaquin. Blood cultures were checked, growing Streptococcus, organism susceptible to vancomycin levofloxacin clindamycin chloramphenicol with intermediate susceptibility to ceftriaxone cefepime, resistant to Cefotaxime. Case discussed with infectious disease and with cardiology. Given high suspicion for infectious endocarditis he had a transesophageal echocardiogram that was performed on 05/23/2013 which showed a small 8 mm x 3 mm screw and like, a mobile vegetation on the atrial aspect of the body of the posterior leaflet. Findings suggestive of endocarditis. Meanwhile his blood cultures drawn on 05/21/2013 have grown gram-positive cocci in chains from both cultures. Infectious disease following, patient remains on IV vancomycin. He remains hemodynamically stable, afebrile, without clinical signs or symptoms of congestive heart failure.                                                                                               Assessment/Plan: 1. Endocarditis.  -Patient presenting with a 2 to 58-month history of generalized weakness, malaise, fevers, chills, night sweats, 20 pound weight loss.  -Blood cultures obtained at his primary care physician's office on 05/17/13 growing Viridans Streptococcus group.  -Blood culture report faxed to 2 W, it appears organism is susceptible to vancomycin, levofloxacin clindamycin, with intermediate  susceptibility to erythromycin ceftriaxone and cefepime, resistant to penicillin and cefotaxime.  -Blood culture report left in paper chart.   -Transesophageal echocardiogram showing mobile vegetation on the atrial aspect of the body of the posterior leaflet -Repeat Blood Cultures drawn on 05/21/2013 growing gram-positive cocci in chains from both cultures  -Remains on IV vancomycin -Await further recs from ID -Hemodynamically stable, no evidence of CHF - he underwent pantogram and shows abscess in the left lower first molar. Oral surgeon consulted to see if the tooth needs extraction.  - appreciate Gi recommendations.   2.  Type 2 diabetes mellitus - Blood sugars stable -Continue on Accu-Cheks with sliding scale coverage CBG (last 3)   Recent Labs  05/24/13 1612 05/24/13 2211 05/25/13 0637  GLUCAP 129* 157* 142*     3. Hypertension -Blood pressures improved, will restart amlodipine with holding parameters  4. Thickened Tissue on Appendix -CT scan of abdomen and pelvis showing thickened tissue at the orifice of the appendix extending up to the cecum. -Radiology report stating a recommendation for colonoscopy for evaluation and potential sampling to exclude appendiceal carcinoma. -GI consulted placed for further recommendations,   5. DVT prophylaxis -Subcutaneous Lovenox   Code Status: Full Code Family Communication: discussed with wife at bedside on 4/15 Disposition Plan: Continue IV antimicrobial therapy, ID following, GI consulted for CT scan findings   Consultants:  ID  Cardiology  Procedures:  Transesophageal echocardiogram  performed on 05/23/2013  Pantogram  Antibiotics:  Vancomycin (started on 05/21/13)  HPI/Subjective: He denies any complaints Objective: Filed Vitals:   05/25/13 0447  BP: 121/76  Pulse: 70  Temp: 98.6 F (37 C)  Resp: 18    Intake/Output Summary (Last 24 hours) at 05/25/13 0902 Last data filed at 05/25/13 0800  Gross per 24  hour  Intake    264 ml  Output   3650 ml  Net  -3386 ml   Filed Weights   05/23/13 0500 05/24/13 0412 05/25/13 0621  Weight: 77.157 kg (170 lb 1.6 oz) 76.749 kg (169 lb 3.2 oz) 76.8 kg (169 lb 5 oz)    Exam:   General:  Patient is in no acute distress, nontoxic appearing awake alert and oriented  Cardiovascular: Regular rate and rhythm normal S1-S2  Respiratory: Clear to auscultation bilaterally  Abdomen: No pain, positive bowel sounds  Musculoskeletal: No edema  Skin: Did not note rashes  Data Reviewed: Basic Metabolic Panel:  Recent Labs Lab 05/21/13 1856 05/22/13 0602 05/23/13 0415 05/24/13 0310  NA 141 138 138  139 139  K 4.0 3.8 4.2  4.4 3.8  CL 102 102 104  104 104  CO2 21 20 20  19 21   GLUCOSE 152* 130* 148*  147* 167*  BUN 28* 19 12  12 13   CREATININE 0.94 0.75 0.73  0.73 0.69  CALCIUM 9.6 8.9 9.0  9.0 9.3   Liver Function Tests:  Recent Labs Lab 05/21/13 1856 05/22/13 0602  AST 23 15  ALT 21 16  ALKPHOS 73 60  BILITOT 0.5 0.6  PROT 7.3 6.2  ALBUMIN 3.6 3.0*   No results found for this basename: LIPASE, AMYLASE,  in the last 168 hours No results found for this basename: AMMONIA,  in the last 168 hours CBC:  Recent Labs Lab 05/21/13 1856 05/22/13 0602 05/23/13 0415 05/24/13 0310  WBC 9.2 7.1 7.5 7.0  NEUTROABS 7.5  --   --   --   HGB 14.2 12.6* 12.7* 12.9*  HCT 41.8 37.3* 38.0* 38.4*  MCV 82.1 80.4 81.2 81.7  PLT 222 187 202 206   Cardiac Enzymes:  Recent Labs Lab 05/23/13 0415  CKTOTAL 38   BNP (last 3 results) No results found for this basename: PROBNP,  in the last 8760 hours CBG:  Recent Labs Lab 05/24/13 0653 05/24/13 1118 05/24/13 1612 05/24/13 2211 05/25/13 0637  GLUCAP 145* 160* 129* 157* 142*    Recent Results (from the past 240 hour(s))  CULTURE, BLOOD (ROUTINE X 2)     Status: None   Collection Time    05/21/13 10:27 PM      Result Value Ref Range Status   Specimen Description BLOOD LEFT ARM    Final   Special Requests BOTTLES DRAWN AEROBIC AND ANAEROBIC 5CC EACH   Final   Culture  Setup Time     Final   Value: 05/22/2013 02:27     Performed at [facility]   Culture     Final   Value: VIRIDANS STREPTOCOCCUS     Note: SUSCEPTIBILITIES PERFORMED ON PREVIOUS CULTURE WITHIN THE LAST 5 DAYS.     Note: Gram Stain Report Called to,Read Back By and Verified With: MARIA LESTER AT 0608 05/23/13 BY SNOLO     Performed at [facility]   Report Status 05/25/2013 FINAL   Final  CULTURE, BLOOD (ROUTINE X 2)     Status: None   Collection Time  05/21/13 10:30 PM      Result Value Ref Range Status   Specimen Description BLOOD RIGHT ARM   Final   Special Requests BOTTLES DRAWN AEROBIC ONLY 5CC   Final   Culture  Setup Time     Final   Value: 05/22/2013 02:27     Performed at [facility]   Culture     Final   Value: VIRIDANS STREPTOCOCCUS     Note: Gram Stain Report Called to,Read Back By and Verified With: MARIA LESTER AT 8:07 P.M. ON 05/22/13 WARRB     Performed at [facility]   Report Status 05/25/2013 FINAL   Final   Organism ID, Bacteria VIRIDANS STREPTOCOCCUS   Final  CULTURE, BLOOD (ROUTINE X 2)     Status: None   Collection Time    05/23/13  7:10 PM      Result Value Ref Range Status   Specimen Description BLOOD RIGHT ARM   Final   Special Requests BOTTLES DRAWN AEROBIC AND ANAEROBIC 10CC EACH   Final   Culture  Setup Time     Final   Value: 05/24/2013 00:56     Performed at [facility]   Culture     Final   Value:        BLOOD CULTURE RECEIVED NO GROWTH TO DATE CULTURE WILL BE HELD FOR 5 DAYS BEFORE ISSUING A FINAL NEGATIVE REPORT     Performed at [facility]   Report Status PENDING   Incomplete  CULTURE, BLOOD (ROUTINE X 2)     Status: None   Collection Time    05/23/13  7:20 PM      Result Value Ref Range Status   Specimen Description BLOOD RIGHT HAND   Final   Special Requests BOTTLES DRAWN AEROBIC ONLY 10CC   Final    Culture  Setup Time     Final   Value: 05/24/2013 00:57     Performed at [facility]   Culture     Final   Value:        BLOOD CULTURE RECEIVED NO GROWTH TO DATE CULTURE WILL BE HELD FOR 5 DAYS BEFORE ISSUING A FINAL NEGATIVE REPORT     Performed at [facility]   Report Status PENDING   Incomplete     Studies: Dg Orthopantogram  05/24/2013   CLINICAL DATA:  Evaluate source of endocarditis  EXAM: ORTHOPANTOGRAM/PANORAMIC  COMPARISON:  None.  FINDINGS: There is a subtle periapical lucency involving the left lower first molar. Dental amalgam is identified involving the upper and lower teeth.  IMPRESSION: 1. Subtle periapical lucency involving the left lower first molar which may indicate abscess.   Electronically Signed   By: Signa Kellaylor  Stroud M.D.   On: 05/24/2013 08:45    Scheduled Meds: . amLODipine  5 mg Oral Daily  . aspirin EC  81 mg Oral Daily  . enoxaparin (LOVENOX) injection  40 mg Subcutaneous Q24H  . ezetimibe  10 mg Oral q morning - 10a  . insulin aspart  0-5 Units Subcutaneous QHS  . insulin aspart  0-9 Units Subcutaneous TID WC  . lisinopril  20 mg Oral Daily  . loratadine  10 mg Oral Daily  . sodium chloride  3 mL Intravenous Q12H  . vancomycin  1,000 mg Intravenous Q12H   Continuous Infusions: . sodium chloride 75 mL/hr at 05/24/13 2221    Active Problems:   FUO (fever of unknown origin)   H/O spinal  fusion   H/O Spinal surgery   Bacteremia    Time spent:25  minutes    Hosie Poisson  [facility] Pager [phone]. If 7PM-7AM, please contact night-coverage at [email or web address], password [facility] 05/25/2013, 9:02 AM  LOS: 4 days

## 2013-05-25 NOTE — Progress Notes (Signed)
Subjective: No acute events.    Objective: Vital signs in last 24 hours: Temp:  [97.9 F (36.6 C)-99 F (37.2 C)] 98.1 F (36.7 C) (04/16 1535) Pulse Rate:  [70-86] 75 (04/16 1535) Resp:  [18] 18 (04/16 1535) BP: (115-137)/(59-85) 115/59 mmHg (04/16 1535) SpO2:  [99 %-100 %] 100 % (04/16 1535) Weight:  [169 lb 5 oz (76.8 kg)] 169 lb 5 oz (76.8 kg) (04/16 0621) Last BM Date: 05/24/13  Intake/Output from previous day: 04/15 0701 - 04/16 0700 In: 504 [P.O.:504] Out: 3350 [Urine:3350] Intake/Output this shift: Total I/O In: 720 [P.O.:720] Out: 1151 [Urine:1150; Stool:1]  General appearance: alert and no distress GI: soft, non-tender; bowel sounds normal; no masses,  no organomegaly  Lab Results:  Recent Labs  05/23/13 0415 05/24/13 0310  WBC 7.5 7.0  HGB 12.7* 12.9*  HCT 38.0* 38.4*  PLT 202 206   BMET  Recent Labs  05/23/13 0415 05/24/13 0310  NA 138  139 139  K 4.2  4.4 3.8  CL 104  104 104  CO2 20  19 21   GLUCOSE 148*  147* 167*  BUN 12  12 13   CREATININE 0.73  0.73 0.69  CALCIUM 9.0  9.0 9.3   LFT No results found for this basename: PROT, ALBUMIN, AST, ALT, ALKPHOS, BILITOT, BILIDIR, IBILI,  in the last 72 hours PT/INR No results found for this basename: LABPROT, INR,  in the last 72 hours Hepatitis Panel  Recent Labs  05/23/13 0415  HEPBSAG NEGATIVE  HCVAB NEGATIVE  HEPAIGM NON REACTIVE  HEPBIGM NON REACTIVE   C-Diff No results found for this basename: CDIFFTOX,  in the last 72 hours Fecal Lactopherrin No results found for this basename: FECLLACTOFRN,  in the last 72 hours  Studies/Results: Dg Orthopantogram  05/24/2013   CLINICAL DATA:  Evaluate source of endocarditis  EXAM: ORTHOPANTOGRAM/PANORAMIC  COMPARISON:  None.  FINDINGS: There is a subtle periapical lucency involving the left lower first molar. Dental amalgam is identified involving the upper and lower teeth.  IMPRESSION: 1. Subtle periapical lucency involving the left  lower first molar which may indicate abscess.   Electronically Signed   By: Kerby Moors M.D.   On: 05/24/2013 08:45    Medications:  Scheduled: . amLODipine  5 mg Oral Daily  . aspirin EC  81 mg Oral Daily  . enoxaparin (LOVENOX) injection  40 mg Subcutaneous Q24H  . ezetimibe  10 mg Oral q morning - 10a  . insulin aspart  0-5 Units Subcutaneous QHS  . insulin aspart  0-9 Units Subcutaneous TID WC  . lisinopril  20 mg Oral Daily  . loratadine  10 mg Oral Daily  . penicillin g continuous IV infusion  9 Million Units Intravenous Q12H  . sodium chloride  3 mL Intravenous Q12H   Continuous: . sodium chloride 75 mL/hr at 05/24/13 2221    Assessment/Plan: 1) Abnormal CT scan of the cecum 2) Endocarditis.   The patient is stable from the GI standpoint.  He remarked to Dr. Collene Mares that he is interested in transferring his care to [place].  No intervention is required at this time.    Plan: 1) Continue with antibiotics. 2) If the patient decides to follow up in [facility] for his GI care he can follow up with Dr. Collene Mares.  I provided him with the requested business card. 3) Signing off.   LOS: 4 days   Beryle Beams 05/25/2013, 5:42 PM

## 2013-05-25 NOTE — Consult Note (Signed)
DENTAL CONSULTATION  Date of Consultation:  05/25/2013 Patient Name:   Jonathan Blackburn Date of Birth:   [birth year] Medical Record Number: [id number]  VITALS: BP 121/76  Pulse 70  Temp(Src) 98.6 F (37 C) (Oral)  Resp 18  Ht 5\' 7"  (1.702 m)  Wt 169 lb 5 oz (76.8 kg)  BMI 26.51 kg/m2  SpO2 99%   CHIEF COMPLAINT: The patient was referred for evaluation of dentition to rule out source of endocarditis.  HPI: Jonathan Blackburn 69 year old male recently diagnosed with bacterial endocarditis. A dental consultation was requested to evaluate dentition to rule out source of endocarditis.   The patient currently denies having any acute toothache, swellings, or abscesses. Patient was last seen for an exam and cleaning by Dr. Jenne Campus. Patient is usually seen 2 times a year for periodontal therapy. Patient had previously taken antibiotic prophylaxis prior to invasive dental procedures but discontinued the process when there was a change in the American Heart Association guidelines. Patient last had dental treatment on the lower left quadrant approximately one to one and a half years ago. Patient had a crown placed on one of the lower left molars at that time. The patient again denies any sensitivity associated with the lower left molars numbers 18 or 19.   PROBLEM LIST: Patient Active Problem List   Diagnosis Date Noted  . Bacteremia 05/23/2013  . FUO (fever of unknown origin) 05/22/2013  . H/O spinal fusion 05/22/2013  . H/O Spinal surgery 05/22/2013    PMH: Past Medical History  Diagnosis Date  . Diabetes mellitus without complication   . Hypertension   . Heart murmur   . Arthritis     PSH: Past Surgical History  Procedure Laterality Date  . Back surgery    . Shoulder arthroscopy      rotator cuff  . Posterior lumbar fusion 4 level N/A 11/16/2012    Procedure: Thoracic seven to thoracic eleven posterior lateral thoracic arthrodesis with instrumentation and bone graft ;  Surgeon: Ophelia Charter, MD;  Location: [place] [facility]URO [facility]S;  Service: Neurosurgery;  Laterality: N/A;  Thoracic seven to thoracic eleven posterior lateral thoracic arthrodesis with instrumentation and bone graft  . Tee without cardioversion N/A 05/23/2013    Procedure: TRANSESOPHAGEAL ECHOCARDIOGRAM (TEE);  Surgeon: Candee Furbish, MD;  Location: [facility];  Service: Cardiovascular;  Laterality: N/A;    ALLERGIES: No Known Allergies  MEDICATIONS: Current Facility-Administered Medications  Medication Dose Route Frequency Provider Last Rate Last Dose  . 0.9 %  sodium chloride infusion   Intravenous Continuous Kelvin Cellar, MD 75 mL/hr at 05/24/13 2221    . acetaminophen (TYLENOL) tablet 650 mg  650 mg Oral Q6H PRN Berle Mull, MD   650 mg at 05/24/13 2221   Or  . acetaminophen (TYLENOL) suppository 650 mg  650 mg Rectal Q6H PRN Berle Mull, MD      . amLODipine (NORVASC) tablet 5 mg  5 mg Oral Daily Kelvin Cellar, MD   5 mg at 05/24/13 1038  . aspirin EC tablet 81 mg  81 mg Oral Daily Berle Mull, MD   81 mg at 05/24/13 1038  . enoxaparin (LOVENOX) injection 40 mg  40 mg Subcutaneous Q24H Berle Mull, MD   40 mg at 05/24/13 1038  . ezetimibe (ZETIA) tablet 10 mg  10 mg Oral q morning - 10a Berle Mull, MD   10 mg at 05/24/13 1038  . HYDROcodone-acetaminophen (NORCO/VICODIN) 5-325 MG per tablet 1-2 tablet  1-2 tablet Oral Q4H PRN Diamantina Providence  Posey Pronto, MD      . insulin aspart (novoLOG) injection 0-5 Units  0-5 Units Subcutaneous QHS Berle Mull, MD      . insulin aspart (novoLOG) injection 0-9 Units  0-9 Units Subcutaneous TID WC Berle Mull, MD   1 Units at 05/25/13 0640  . lisinopril (PRINIVIL,ZESTRIL) tablet 20 mg  20 mg Oral Daily Kelvin Cellar, MD   20 mg at 05/24/13 1038  . loratadine (CLARITIN) tablet 10 mg  10 mg Oral Daily Berle Mull, MD   10 mg at 05/24/13 1038  . ondansetron (ZOFRAN) tablet 4 mg  4 mg Oral Q6H PRN Berle Mull, MD       Or  . ondansetron (ZOFRAN) injection 4 mg  4 mg Intravenous  Q6H PRN Berle Mull, MD      . phenol (CHLORASEPTIC) mouth spray 1 spray  1 spray Mouth/Throat PRN Kelvin Cellar, MD   1 spray at 05/23/13 1636  . sodium chloride 0.9 % injection 3 mL  3 mL Intravenous Q12H Berle Mull, MD   3 mL at 05/24/13 2216  . vancomycin (VANCOCIN) IVPB 1000 mg/200 mL premix  1,000 mg Intravenous Q12H Kelvin Cellar, MD   1,000 mg at 05/25/13 9675    LABS: Lab Results  Component Value Date   WBC 7.0 05/24/2013   HGB 12.9* 05/24/2013   HCT 38.4* 05/24/2013   MCV 81.7 05/24/2013   PLT 206 05/24/2013      Component Value Date/Time   NA 139 05/24/2013 0310   K 3.8 05/24/2013 0310   CL 104 05/24/2013 0310   CO2 21 05/24/2013 0310   GLUCOSE 167* 05/24/2013 0310   BUN 13 05/24/2013 0310   CREATININE 0.69 05/24/2013 0310   CALCIUM 9.3 05/24/2013 0310   GFRNONAA >90 05/24/2013 0310   GFRAA >90 05/24/2013 0310   Lab Results  Component Value Date   INR 1.15 05/22/2013   No results found for this basename: PTT    SOCIAL HISTORY: History   Social History  . Marital Status: Single    Spouse Name: N/A    Number of Children: N/A  . Years of Education: N/A   Occupational History  . Not on file.   Social History Main Topics  . Smoking status: Never Smoker   . Smokeless tobacco: Not on file  . Alcohol Use: Yes     Comment: weekly  . Drug Use: No  . Sexual Activity: Not on file   Other Topics Concern  . Not on file   Social History Narrative  . No narrative on file    FAMILY HISTORY: History reviewed. No pertinent family history.   REVIEW OF SYSTEMS: Reviewed from chart for this admission.  DENTAL HISTORY: CHIEF COMPLAINT: The patient was referred for evaluation of dentition to rule out source of endocarditis.  HPI: Jonathan Blackburn 68 year old male recently diagnosed with bacterial endocarditis. A dental consultation was requested to evaluate dentition to rule out source of endocarditis.   The patient currently denies having any acute toothache,  swellings, or abscesses. Patient was last seen for an exam and cleaning by Dr. Jenne Campus. Patient is usually seen 2 times a year for periodontal therapy. Patient had previously taken antibiotic prophylaxis prior to invasive dental procedures but discontinued the process when there was a change in the American Heart Association guidelines. Patient last had dental treatment on the lower left quadrant approximately one to one and a half years ago. Patient had a crown placed on one of the lower left  molars at that time. The patient again denies any sensitivity associated with the lower left molars numbers 18 or 19.   DENTAL EXAMINATION:  GENERAL: Patient is a well-developed, well-nourished male in no acute distress. HEAD AND NECK: There is no palpable submandibular lymphadenopathy. The patient denies acute TMJ symptoms. INTRAORAL EXAM: The patient has normal saliva. I do not see any evidence of abscess formation. The patient has very good oral hygiene. DENTITION: The patient is missing tooth numbers 1, 4, 16, 17, and 32. PERIODONTAL: Patient has chronic periodontitis with minimal plaque accumulations, selective areas of gingival recession and no significant tooth mobility. DENTAL CARIES/SUBOPTIMAL RESTORATIONS: There are no obvious dental caries noted at this time. I would need a full series of dental radiographs to rule out incipient dental caries. ENDODONTIC: Patient currently denies acute pulpitis symptoms. I do not see any significant periapical pathology or radiolucency. I would need a full series of dental radiographs to rule out other incipient periapical pathology. Radiologist report did indicate possible subtle changes at the apex of tooth #19. The patient denies any percussion or palpation sensitivity associated with tooth #19. CROWN AND BRIDGE: Patient has multiple crown restorations noted. PROSTHODONTIC: Patient denies having any partial dentures. OCCLUSION: Patient has a stable  occlusion.  RADIOGRAPHIC INTERPRETATION: Orthopantogram was taken on 05/24/2013. There are multiple missing teeth numbers 1, 4, 16, 17, and 32. There is incipient bone loss. Multiple dental restorations are noted. I do not see any periapical pathology or radiolucency. However, the radiologist reported possible incipient changes at the apex of tooth #19. I would recommend followup with periapical radiographs by his primary dentist once discharged.   ASSESSMENTS: 1. 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 Chronic periodontitis with incipient bone loss 3. Accretions-minimal 4. Missing Teeth 5. A stable occlusion 6. Possible incipient periapical changes associated with tooth #19 per radiologist report. Clinically, patient denies any acute pulpitis symptoms. It is not sensitive to percussion or palpation.  PLAN/RECOMMENDATIONS: 1. I discussed the risks, benefits, and complications of various treatment options with the patient in relationship to his medical and dental conditions and recent endocarditis diagnosis. We discussed various treatment options at this time. I recomended follow up with his primary dentist, Dr. Jenne Campus, for periapical radiographs to rule out periapical pathology and provide root canal therapy or extraction if indicated. The patient is to contact dental medicine as needed once discharged. Patient is aware that he will require antibiotic premedication prior to invasive dental procedures in the future due to with history of bacterial endocarditis.  2. Discussion of findings with medical team and coordination of future medical and dental care as needed.    Lenn Cal, DDS

## 2013-05-25 NOTE — Care Management Note (Signed)
    Page 1 of 2   05/26/2013     4:21:01 PM CARE MANAGEMENT NOTE 05/26/2013  Patient:  Jonathan Blackburn, Jonathan Blackburn   Account Number:  [id number]  Date Initiated:  05/25/2013  Documentation initiated by:  Nyima Vanacker  Subjective/Objective Assessment:   PT ADM ON 05/21/13 WITH POS BCX, MITRAL VALVE ENDOCARDITIS. PTA, PT INDEPENDENT, LIVES WITH SIGNIFICANT OTHER.     Action/Plan:   PT WILL NEED HHRN FOR IV ANTIBIOTICS AT HOME VIA PICC LINE UPON DC.  GIRLFRIEND STATES SHE IS WILLING/ABLE TO ASSIST WITH INFUSIONS.  REFERRAL TO [facility], PER PT CHOICE.   Anticipated DC Date:  05/26/2013   Anticipated DC Plan:  [place]  CM consult      [facility] Choice  HOME HEALTH   Choice offered to / List presented to:  C-1 Patient   DME arranged  IV PUMP/EQUIPMENT      DME agency  [place] arranged  HH-1 RN      [place].   Status of service:  Completed, signed off Medicare Important Message given?   (If response is "NO", the following Medicare IM given date fields will be blank) Date Medicare IM given:   Date Additional Medicare IM given:    Discharge Disposition:  [place]  Per UR Regulation:  Reviewed for med. necessity/level of care/duration of stay  If discussed at [place] of Stay Meetings, dates discussed:    Comments:  05/26/13 Rissa Turley,RN,BSN [phone] PT [place], PER MD.  PICC LINE TO BE PLACED THIS AFTERNOON.  NOTIFIED [facility], WHO STATES AFTER PICC IS PLACED, WILL PLAN TO HOOK PT UP TO CONT INFUSION OF PCN VIA INFUSION PUMP.

## 2013-05-25 NOTE — Progress Notes (Addendum)
[place] for Infectious Disease  Day #5 vancomycin  Subjective: Afebrile    Medications: Scheduled Meds: . amLODipine  5 mg Oral Daily  . aspirin EC  81 mg Oral Daily  . enoxaparin (LOVENOX) injection  40 mg Subcutaneous Q24H  . ezetimibe  10 mg Oral q morning - 10a  . insulin aspart  0-5 Units Subcutaneous QHS  . insulin aspart  0-9 Units Subcutaneous TID WC  . lisinopril  20 mg Oral Daily  . loratadine  10 mg Oral Daily  . penicillin g continuous IV infusion  9 Million Units Intravenous Q12H  . sodium chloride  3 mL Intravenous Q12H  . vancomycin  1,000 mg Intravenous Q12H    Objective: Weight change: 1.8 oz (0.051 kg)  Intake/Output Summary (Last 24 hours) at 05/25/13 1423 Last data filed at 05/25/13 1100  Gross per 24 hour  Intake    744 ml  Output   3950 ml  Net  -3206 ml   Blood pressure 136/85, pulse 86, temperature 97.9 F (36.6 C), temperature source Oral, resp. rate 18, height 5\' 7"  (1.702 m), weight 169 lb 5 oz (76.8 kg), SpO2 99.00%. Temp:  [97.9 F (36.6 C)-99 F (37.2 C)] 97.9 F (36.6 C) (04/16 1100) Pulse Rate:  [70-87] 86 (04/16 1100) Resp:  [18] 18 (04/16 0447) BP: (121-137)/(69-85) 136/85 mmHg (04/16 1045) SpO2:  [99 %] 99 % (04/16 1100) Weight:  [169 lb 5 oz (76.8 kg)] 169 lb 5 oz (76.8 kg) (04/16 5366)  Physical Exam: General: Alert and awake, oriented x3, not in any acute distress. CVS regular rate, II/vi systolic murmur Chest: clear to auscultation bilaterally, no wheezing, rales or rhonchi  Skin: no rashes     CBC:  Recent Labs Lab 05/21/13 1856 05/22/13 0602 05/23/13 0415 05/24/13 0310  HGB 14.2 12.6* 12.7* 12.9*  HCT 41.8 37.3* 38.0* 38.4*  PLT 222 187 202 206  INR  --  1.15  --   --      BMET  Recent Labs  05/23/13 0415 05/24/13 0310  NA 138  139 139  K 4.2  4.4 3.8  CL 104  104 104  CO2 20  19 21   GLUCOSE 148*  147* 167*  BUN 12  12 13   CREATININE 0.73  0.73 0.69  CALCIUM 9.0  9.0 9.3     C-Reactive Protein  Recent Labs  05/23/13 0415  CRP 4.4*    Micro Results: 4/14 blood cx: ngtd 4/12: blood cx: viridans strep Pen high susceptible  Studies/Results: Dg Orthopantogram  05/24/2013   CLINICAL DATA:  Evaluate source of endocarditis  EXAM: ORTHOPANTOGRAM/PANORAMIC  COMPARISON:  None.  FINDINGS: There is a subtle periapical lucency involving the left lower first molar. Dental amalgam is identified involving the upper and lower teeth.  IMPRESSION: 1. Subtle periapical lucency involving the left lower first molar which may indicate abscess.   Electronically Signed   By: Kerby Moors M.D.   On: 05/24/2013 08:45   Assessment/Plan:  Active Problems:   FUO (fever of unknown origin)   H/O spinal fusion   H/O Spinal surgery   Bacteremia   Jonathan Blackburn is a 68 y.o. 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 Will change him to penicillin continuously infused. 18MU/day continuously. Use 4/14 as day #1 will need 28 days of treatment  Can place picc line tomorrow if cx from 4/14 stay negative   #  2: panorex of teeth shows small abscess. Appreciate Dr. Ritta Slot evaluation.    #3 Thickening in cecum: can defer to outpatient thus far    LOS: 4 days   Carlyle Basques 05/25/2013, 2:23 PM

## 2013-05-25 NOTE — Progress Notes (Signed)
Patient ID: Jonathan Blackburn, male   DOB: [birth year], 68 y.o.   MRN: [id number] Subjective:  The patient is alert and pleasant. He feels better.  Objective: Vital signs in last 24 hours: Temp:  [97.9 F (36.6 C)-99 F (37.2 C)] 98.6 F (37 C) (04/16 0447) Pulse Rate:  [70-87] 70 (04/16 0447) Resp:  [18] 18 (04/16 0447) BP: (121-137)/(69-79) 121/76 mmHg (04/16 0447) SpO2:  [99 %] 99 % (04/16 0447) Weight:  [76.8 kg (169 lb 5 oz)] 76.8 kg (169 lb 5 oz) (04/16 0621)  Intake/Output from previous day: 04/15 0701 - 04/16 0700 In: 504 [P.O.:504] Out: 3350 [Urine:3350] Intake/Output this shift: Total I/O In: -  Out: 300 [Urine:300]  Physical exam the patient is alert and oriented. His strength is normal. His lumbar  and thoracolumbar incision is well-healed.  Lab Results:  Recent Labs  05/23/13 0415 05/24/13 0310  WBC 7.5 7.0  HGB 12.7* 12.9*  HCT 38.0* 38.4*  PLT 202 206   BMET  Recent Labs  05/23/13 0415 05/24/13 0310  NA 138  139 139  K 4.2  4.4 3.8  CL 104  104 104  CO2 20  19 21   GLUCOSE 148*  147* 167*  BUN 12  12 13   CREATININE 0.73  0.73 0.69  CALCIUM 9.0  9.0 9.3    Studies/Results: Dg Orthopantogram  05/24/2013   CLINICAL DATA:  Evaluate source of endocarditis  EXAM: ORTHOPANTOGRAM/PANORAMIC  COMPARISON:  None.  FINDINGS: There is a subtle periapical lucency involving the left lower first molar. Dental amalgam is identified involving the upper and lower teeth.  IMPRESSION: 1. Subtle periapical lucency involving the left lower first molar which may indicate abscess.   Electronically Signed   By: Kerby Moors M.D.   On: 05/24/2013 08:45    Assessment/Plan: Streptococcal endocarditis: The patient seems to be improving. I will plan to see him for routine followup in the office. Please let me know if I can help.  LOS: 4 days     Ophelia Charter 05/25/2013, 10:12 AM

## 2013-05-26 DIAGNOSIS — IMO0001 Reserved for inherently not codable concepts without codable children: Secondary | ICD-10-CM | POA: Diagnosis present

## 2013-05-26 DIAGNOSIS — A491 Streptococcal infection, unspecified site: Secondary | ICD-10-CM | POA: Diagnosis present

## 2013-05-26 DIAGNOSIS — I10 Essential (primary) hypertension: Secondary | ICD-10-CM | POA: Diagnosis present

## 2013-05-26 DIAGNOSIS — E1165 Type 2 diabetes mellitus with hyperglycemia: Secondary | ICD-10-CM

## 2013-05-26 DIAGNOSIS — K047 Periapical abscess without sinus: Secondary | ICD-10-CM | POA: Diagnosis present

## 2013-05-26 LAB — GLUCOSE, CAPILLARY
GLUCOSE-CAPILLARY: 170 mg/dL — AB (ref 70–99)
Glucose-Capillary: 151 mg/dL — ABNORMAL HIGH (ref 70–99)
Glucose-Capillary: 135 mg/dL — ABNORMAL HIGH (ref 70–99)

## 2013-05-26 MED ORDER — PENICILLIN G POTASSIUM 20000000 UNITS IJ SOLR: 9.0000 10*6.[IU] | Freq: Two times a day (BID) | INTRAVENOUS | Status: DC

## 2013-05-26 MED ORDER — ACETAMINOPHEN 325 MG PO TABS: 650.0000 mg | ORAL_TABLET | Freq: Four times a day (QID) | ORAL | Status: DC | PRN

## 2013-05-26 MED ORDER — SODIUM CHLORIDE 0.9 % IJ SOLN: 10.0000 mL | INTRAMUSCULAR | Status: DC | PRN

## 2013-05-26 NOTE — Discharge Summary (Signed)
Physician Discharge Summary  Jonathan Blackburn PJA:[id number] DOB: Aug 25, 1945 DOA: 05/21/2013  PCP: Fae Pippin  Admit date: 05/21/2013 Discharge date: 05/26/2013  Time spent: 30 minutes  Recommendations for Outpatient Follow-up:  1. Follow up with PCP in one week 2. Follow up with ID / Dr Tommy Medal as recommended in 2 to 3 weks.  3. Follow up with weekly cbc and bmp and fax the report to Dr Tommy Medal office at [phone] 4. Follow up with gastroenterology Dr Collene Mares in 2 to 4 weeks.  5. followu pwith Dr Jenne Campus as recommended.   Discharge Diagnoses:  Active Problems:   FUO (fever of unknown origin)   H/O spinal fusion   H/O Spinal surgery   Bacteremia   Streptococcus viridans infection   Tooth abscess   Essential hypertension, benign   Type II or unspecified type diabetes mellitus without mention of complication, uncontrolled   Discharge Condition: improved.   Diet recommendation: regular  Filed Weights   05/24/13 0412 05/25/13 0621 05/26/13 0438  Weight: 76.749 kg (169 lb 3.2 oz) 76.8 kg (169 lb 5 oz) 77.2 kg (170 lb 3.1 oz)    History of present illness:  Patient is a pleasant 68 year old gentleman with a past medical history of type 2 diabetes mellitus, hypertension, admitted to the medicine service on 05/21/2013. He has had fevers, chills, night sweats since the end of January associated with generalized weakness, malaise, fatigue, poor tolerance to physical exertion. He also endorsed a 20 pound weight loss during this time. He was treated for prostatitis by his primary care physician with Levaquin. Blood cultures were checked, growing Streptococcus, organism susceptible to vancomycin levofloxacin clindamycin chloramphenicol with intermediate susceptibility to ceftriaxone cefepime, resistant to Cefotaxime. Case discussed with infectious disease and with cardiology. Given high suspicion for infectious endocarditis he had a transesophageal echocardiogram that was performed on 05/23/2013  which showed a small 8 mm x 3 mm screw and like, a mobile vegetation on the atrial aspect of the body of the posterior leaflet. Findings suggestive of endocarditis. Meanwhile his blood cultures drawn on 05/21/2013 have grown gram-positive cocci in chains from both cultures. Infectious disease following, recommended 6 weeks of IV antibiotics with penicillin for the viridans streptococcus. He remains hemodynamically stable, afebrile, without clinical signs or symptoms of congestive heart failure.    Hospital Course:   1. Endocarditis.  -Patient presenting with a 2 to 99-month history of generalized weakness, malaise, fevers, chills, night sweats, 20 pound weight loss.  -Blood cultures obtained at his primary care physician's office on 05/17/13 growing Viridans Streptococcus group.  -Transesophageal echocardiogram showing mobile vegetation on the atrial aspect of the body of the posterior leaflet  -Repeat Blood Cultures drawn on 05/21/2013 growing gram-positive cocci in chains from both cultures , and cultures from 4/14 are negative so far.  -Hemodynamically stable, no evidence of CHF  - he underwent pantogram and shows abscess in the left lower first molar. Oral surgeon consulted.  - no intervention needed to extract teeth.    2. Type 2 diabetes mellitus  - Blood sugars stable  CBG (last 3)   Recent Labs   05/24/13 1612  05/24/13 2211  05/25/13 0637   GLUCAP  129*  157*  142*    3. Hypertension  -Blood pressures improved, restarted amlodipine   4. Thickened Tissue on Appendix  -CT scan of abdomen and pelvis showing thickened tissue at the orifice of the appendix extending up to the cecum.  -Radiology report stating a recommendation for colonoscopy for  evaluation and potential sampling to exclude appendiceal carcinoma.  -GI consulted placed for further recommendations, outpatient follow up .   Procedures: Transesophageal echocardiogram performed on 05/23/2013   Pantogram   Consultations: ID  Cardiology   Discharge Exam: Filed Vitals:   05/26/13 1314  BP: 134/70  Pulse: 85  Temp: 98.9 F (37.2 C)  Resp: 18   General: Patient is in no acute distress, nontoxic appearing awake alert and oriented  Cardiovascular: Regular rate and rhythm normal S1-S2  Respiratory: Clear to auscultation bilaterally  Abdomen: No pain, positive bowel sounds  Musculoskeletal: No edema  Skin: Did not note rashes    Discharge Instructions You were cared for by a hospitalist during your hospital stay. If you have any questions about your discharge medications or the care you received while you were in the hospital after you are discharged, you can call the unit and asked to speak with the hospitalist on call if the hospitalist that took care of you is not available. Once you are discharged, your primary care physician will handle any further medical issues. Please note that NO REFILLS for any discharge medications will be authorized once you are discharged, as it is imperative that you return to your primary care physician (or establish a relationship with a primary care physician if you do not have one) for your aftercare needs so that they can reassess your need for medications and monitor your lab values.      Discharge Orders   Future Orders Complete By Expires   Diet - low sodium heart healthy  As directed    Discharge instructions  As directed        Medication List    STOP taking these medications       acetaminophen 650 MG CR tablet  Commonly known as:  TYLENOL  Replaced by:  acetaminophen 325 MG tablet     levofloxacin 500 MG tablet  Commonly known as:  LEVAQUIN      TAKE these medications       acetaminophen 325 MG tablet  Commonly known as:  TYLENOL  Take 2 tablets (650 mg total) by mouth every 6 (six) hours as needed for mild pain (or Fever >/= 101).     amLODipine 5 MG tablet  Commonly known as:  NORVASC  Take 5 mg by mouth every  morning.     aspirin EC 81 MG tablet  Take 81 mg by mouth daily.     ezetimibe 10 MG tablet  Commonly known as:  ZETIA  Take 10 mg by mouth every morning.     glipiZIDE 5 MG 24 hr tablet  Commonly known as:  GLUCOTROL XL  Take 5 mg by mouth daily with breakfast.     lisinopril 20 MG tablet  Commonly known as:  PRINIVIL,ZESTRIL  Take 20 mg by mouth every morning.     loratadine 10 MG tablet  Commonly known as:  CLARITIN  Take 10 mg by mouth daily.     penicillin G potassium 9 Million Units in dextrose 5 % 500 mL  Inject 9 Million Units into the vein every 12 (twelve) hours.       No Known Allergies Follow-up Information   Follow up with CONROY,NATHAN, PA-C. Schedule an appointment as soon as possible for a visit in 1 week.   Specialty:  Physician Assistant   Contact information:   [address] [place] [address] [phone]       Follow up  with Alcide Evener, MD. Schedule an appointment as soon as possible for a visit in 2 weeks.   Specialty:  Infectious Diseases   Contact information:   [address] [place] [address] [phone]       Follow up with MANN,JYOTHI, MD. Schedule an appointment as soon as possible for a visit in 4 weeks.   Specialty:  Gastroenterology   Contact information:   [address] [place] [address] [phone]       Follow up with Dr Jenne Campus . Schedule an appointment as soon as possible for a visit in 2 weeks.       The results of significant diagnostics from this hospitalization (including imaging, microbiology, ancillary and laboratory) are listed below for reference.    Significant Diagnostic Studies: Dg Orthopantogram  05/24/2013   CLINICAL DATA:  Evaluate source of endocarditis  EXAM: ORTHOPANTOGRAM/PANORAMIC  COMPARISON:  None.  FINDINGS: There is a subtle periapical lucency involving the left lower first molar. Dental amalgam is identified  involving the upper and lower teeth.  IMPRESSION: 1. Subtle periapical lucency involving the left lower first molar which may indicate abscess.   Electronically Signed   By: Kerby Moors M.D.   On: 05/24/2013 08:45   Dg Chest 2 View  05/22/2013   CLINICAL DATA:  Cough, history of back fracture.  Positive MRSA.  EXAM: CHEST  2 VIEW  COMPARISON:  DG CHEST 2V dated 04/21/2013  FINDINGS: Cardiomediastinal silhouette is unremarkable for this low inspiratory examination with crowded vasculature markings. Minimal bibasilar atelectasis. The lungs are otherwise clear without pleural effusions or focal consolidations. Trachea projects midline and there is no pneumothorax. Included soft tissue planes and osseous structures are non-suspicious. Lower thoracic posterior instrumentation.  IMPRESSION: Minimal bibasilar atelectasis in this low inspiratory portable examination   Electronically Signed   By: Elon Alas   On: 05/22/2013 00:37   Dg Thoracic Spine W/swimmers  05/22/2013   CLINICAL DATA:  History of back fracture, MRSA.  EXAM: THORACIC SPINE - 2 VIEW + SWIMMERS  COMPARISON:  DG THORACIC SPINE 2V dated 03/07/2013; DG CHEST 2V dated 04/21/2013  FINDINGS: Posterior instrumentation and apparent bone graft material transfix single lower thoracic level moderate compression fracture. No acute fracture deformity or dislocation. Intervertebral disc heights generally preserved. No destructive bony lesions. Included prevertebral and paraspinal soft tissue planes are nonsuspicious.  IMPRESSION: Stable appearance of thoracic spine without acute fracture deformity or malalignment.   Electronically Signed   By: Elon Alas   On: 05/22/2013 00:42   Dg Lumbar Spine 2-3 Views  05/22/2013   CLINICAL DATA:  Back fracture, MRSA.  EXAM: LUMBAR SPINE - 2-3 VIEW  COMPARISON:  DG ABDOMEN 1V dated 04/26/2013  FINDINGS: Six non rib-bearing lumbar type vertebral bodies. Lumbar vertebral bodies appear intact and aligned with  maintenance of lumbar lordosis. Mild-to-moderate degenerative disc disease at all levels. Moderate lower lumbar facet arthropathy. Sacroiliac joints are symmetric.  Lower thoracic posterior instrumentation. Included prevertebral and paraspinal soft tissue planes are nonsuspicious.  IMPRESSION: Degenerative change of the lumbar spine without acute fracture deformity or malalignment.   Electronically Signed   By: Elon Alas   On: 05/22/2013 00:48   Ct Chest W Contrast  05/22/2013   CLINICAL DATA:  Two months of generalized weakness, weight and fever  EXAM: CT CHEST, ABDOMEN, AND PELVIS WITH CONTRAST  TECHNIQUE: Multidetector CT imaging of the chest, abdomen and pelvis was performed following the standard  protocol during bolus administration of intravenous contrast.  CONTRAST:  150mL OMNIPAQUE IOHEXOL 300 MG/ML  SOLN  COMPARISON:  DG THORACIC SPINE W/SWIMMERS dated 05/22/2013; CT T SPINE W/O CM dated 10/04/2012  FINDINGS: CT CHEST FINDINGS  No axillary or supraclavicular lymphadenopathy. No mediastinal hilar lymphadenopathy. Mild basilar atelectasis. No evidence of pulmonary infection. Airways are normal.  CT ABDOMEN AND PELVIS FINDINGS  No focal hepatic lesion. Small gallstone within the gallbladder. No evidence of gallbladder wall inflammation. The pancreas, spleen, adrenal glands are normal. There is a 10 mm calculus at the right ureteropelvic junction. Mild pelvicaliectasis on the right. Several small nonobstructing left renal calculi are noted. No ureterolithiasis evident.  Stomach, small bowel, and distal appendix are normal. The appendix does extend inferiorly into a right inguinal hernia. There is thickened tissue at the orifice of the appendix extending into the cecum measuring 18 mm x20 mm (image 95, series 2). The colon and rectosigmoid colon are otherwise normal.  Abdominal aorta is normal caliber. No retroperitoneal or periportal lymphadenopathy.  No free fluid the pelvis. The prostate gland and  bladder normal. Bilateral fat filled inguinal hernias. No aggressive osseous lesion. Posterior thoracic fusion noted.  IMPRESSION: *Thickened tissue at the orifice of the appendix extending into the cecum. Recommend colonoscopy for evaluation and potential sampling to exclude appendiceal carcinoma. Alternatively this could relate to chronic inflammation. *Mid and distal appendix are normal with the tip extending into a right inguinal hernia. *Mildly obstructing calculus at the right ureteropelvic junction with pelvicaliectasis. *Bilateral nephrolithiasis. *Cholelithiasis without evidence of cholecystitis.   Electronically Signed   By: Suzy Bouchard M.D.   On: 05/22/2013 18:50   Ct Abdomen Pelvis W Contrast  05/22/2013   CLINICAL DATA:  Two months of generalized weakness, weight and fever  EXAM: CT CHEST, ABDOMEN, AND PELVIS WITH CONTRAST  TECHNIQUE: Multidetector CT imaging of the chest, abdomen and pelvis was performed following the standard protocol during bolus administration of intravenous contrast.  CONTRAST:  133mL OMNIPAQUE IOHEXOL 300 MG/ML  SOLN  COMPARISON:  DG THORACIC SPINE W/SWIMMERS dated 05/22/2013; CT T SPINE W/O CM dated 10/04/2012  FINDINGS: CT CHEST FINDINGS  No axillary or supraclavicular lymphadenopathy. No mediastinal hilar lymphadenopathy. Mild basilar atelectasis. No evidence of pulmonary infection. Airways are normal.  CT ABDOMEN AND PELVIS FINDINGS  No focal hepatic lesion. Small gallstone within the gallbladder. No evidence of gallbladder wall inflammation. The pancreas, spleen, adrenal glands are normal. There is a 10 mm calculus at the right ureteropelvic junction. Mild pelvicaliectasis on the right. Several small nonobstructing left renal calculi are noted. No ureterolithiasis evident.  Stomach, small bowel, and distal appendix are normal. The appendix does extend inferiorly into a right inguinal hernia. There is thickened tissue at the orifice of the appendix extending into the  cecum measuring 18 mm x20 mm (image 95, series 2). The colon and rectosigmoid colon are otherwise normal.  Abdominal aorta is normal caliber. No retroperitoneal or periportal lymphadenopathy.  No free fluid the pelvis. The prostate gland and bladder normal. Bilateral fat filled inguinal hernias. No aggressive osseous lesion. Posterior thoracic fusion noted.  IMPRESSION: *Thickened tissue at the orifice of the appendix extending into the cecum. Recommend colonoscopy for evaluation and potential sampling to exclude appendiceal carcinoma. Alternatively this could relate to chronic inflammation. *Mid and distal appendix are normal with the tip extending into a right inguinal hernia. *Mildly obstructing calculus at the right ureteropelvic junction with pelvicaliectasis. *Bilateral nephrolithiasis. *Cholelithiasis without evidence of cholecystitis.   Electronically Signed  By: Suzy Bouchard M.D.   On: 05/22/2013 18:50    Microbiology: Recent Results (from the past 240 hour(s))  CULTURE, BLOOD (ROUTINE X 2)     Status: None   Collection Time    05/21/13 10:27 PM      Result Value Ref Range Status   Specimen Description BLOOD LEFT ARM   Final   Special Requests BOTTLES DRAWN AEROBIC AND ANAEROBIC [facility] EACH   Final   Culture  Setup Time     Final   Value: 05/22/2013 02:27     Performed at [facility]   Culture     Final   Value: VIRIDANS STREPTOCOCCUS     Note: SUSCEPTIBILITIES PERFORMED ON PREVIOUS CULTURE WITHIN THE LAST 5 DAYS.     Note: Gram Stain Report Called to,Read Back By and Verified With: MARIA LESTER AT 0608 05/23/13 BY SNOLO     Performed at [facility]   Report Status 05/25/2013 FINAL   Final  CULTURE, BLOOD (ROUTINE X 2)     Status: None   Collection Time    05/21/13 10:30 PM      Result Value Ref Range Status   Specimen Description BLOOD RIGHT ARM   Final   Special Requests BOTTLES DRAWN AEROBIC ONLY 5CC   Final   Culture  Setup Time     Final   Value: 05/22/2013  02:27     Performed at [facility]   Culture     Final   Value: VIRIDANS STREPTOCOCCUS     Note: Gram Stain Report Called to,Read Back By and Verified With: MARIA LESTER AT 8:07 P.M. ON 05/22/13 WARRB     Performed at [facility]   Report Status 05/25/2013 FINAL   Final   Organism ID, Bacteria VIRIDANS STREPTOCOCCUS   Final  CULTURE, BLOOD (ROUTINE X 2)     Status: None   Collection Time    05/23/13  7:10 PM      Result Value Ref Range Status   Specimen Description BLOOD RIGHT ARM   Final   Special Requests BOTTLES DRAWN AEROBIC AND ANAEROBIC 10CC EACH   Final   Culture  Setup Time     Final   Value: 05/24/2013 00:56     Performed at [facility]   Culture     Final   Value:        BLOOD CULTURE RECEIVED NO GROWTH TO DATE CULTURE WILL BE HELD FOR 5 DAYS BEFORE ISSUING A FINAL NEGATIVE REPORT     Performed at [facility]   Report Status PENDING   Incomplete  CULTURE, BLOOD (ROUTINE X 2)     Status: None   Collection Time    05/23/13  7:20 PM      Result Value Ref Range Status   Specimen Description BLOOD RIGHT HAND   Final   Special Requests BOTTLES DRAWN AEROBIC ONLY 10CC   Final   Culture  Setup Time     Final   Value: 05/24/2013 00:57     Performed at [facility]   Culture     Final   Value:        BLOOD CULTURE RECEIVED NO GROWTH TO DATE CULTURE WILL BE HELD FOR 5 DAYS BEFORE ISSUING A FINAL NEGATIVE REPORT     Performed at [facility]   Report Status PENDING   Incomplete     Labs: Basic Metabolic Panel:  Recent Labs Lab 05/21/13 1856  05/22/13 0602 05/23/13 0415 05/24/13 0310  NA 141 138 138  139 139  K 4.0 3.8 4.2  4.4 3.8  CL 102 102 104  104 104  CO2 21 20 20  19 21   GLUCOSE 152* 130* 148*  147* 167*  BUN 28* 19 12  12 13   CREATININE 0.94 0.75 0.73  0.73 0.69  CALCIUM 9.6 8.9 9.0  9.0 9.3   Liver Function Tests:  Recent Labs Lab 05/21/13 1856 05/22/13 0602  AST 23 15  ALT 21 16  ALKPHOS 73  60  BILITOT 0.5 0.6  PROT 7.3 6.2  ALBUMIN 3.6 3.0*   No results found for this basename: LIPASE, AMYLASE,  in the last 168 hours No results found for this basename: AMMONIA,  in the last 168 hours CBC:  Recent Labs Lab 05/21/13 1856 05/22/13 0602 05/23/13 0415 05/24/13 0310  WBC 9.2 7.1 7.5 7.0  NEUTROABS 7.5  --   --   --   HGB 14.2 12.6* 12.7* 12.9*  HCT 41.8 37.3* 38.0* 38.4*  MCV 82.1 80.4 81.2 81.7  PLT 222 187 202 206   Cardiac Enzymes:  Recent Labs Lab 05/23/13 0415  CKTOTAL 38   BNP: BNP (last 3 results) No results found for this basename: PROBNP,  in the last 8760 hours CBG:  Recent Labs Lab 05/25/13 1116 05/25/13 1603 05/25/13 2128 05/26/13 0613 05/26/13 1114  GLUCAP 121* 169* 133* 151* 135*       Signed:  Danaja Lasota  [facility] 05/26/2013, 3:21 PM

## 2013-05-26 NOTE — Progress Notes (Signed)
[place] [facility]  Day # 1 high dose PCN  Subjective: Patient better spirits   Antibiotics:  Anti-infectives   Start     Dose/Rate Route Frequency Ordered Stop   05/26/13 0000  penicillin G potassium 9 Million Units in dextrose 5 % 500 mL     9 Million Units 41.7 mL/hr over 12 Hours Intravenous Every 12 hours 05/26/13 1132 06/22/13 2359   05/25/13 1430  penicillin G potassium 9 Million Units in dextrose 5 % 500 mL continuous infusion     9 Million Units 41.7 mL/hr over 12 Hours Intravenous Every 12 hours 05/25/13 1422     05/23/13 0830  vancomycin (VANCOCIN) IVPB 1000 mg/200 mL premix  Status:  Discontinued     1,000 mg 200 mL/hr over 60 Minutes Intravenous Every 12 hours 05/23/13 0828 05/25/13 1434   05/22/13 1332  levofloxacin (LEVAQUIN) tablet 500 mg  Status:  Discontinued     500 mg Oral 60 min pre-op 05/22/13 1332 05/22/13 1459   05/22/13 1200  cefTRIAXone (ROCEPHIN) 2 g in dextrose 5 % 50 mL IVPB  Status:  Discontinued     2 g 100 mL/hr over 30 Minutes Intravenous Every 24 hours 05/22/13 1016 05/22/13 1024   05/22/13 1030  vancomycin (VANCOCIN) IVPB 1000 mg/200 mL premix  Status:  Discontinued     1,000 mg 200 mL/hr over 60 Minutes Intravenous Every 12 hours 05/21/13 2225 05/22/13 1459   05/21/13 2230  vancomycin (VANCOCIN) 1,500 mg in sodium chloride 0.9 % 500 mL IVPB     1,500 mg 250 mL/hr over 120 Minutes Intravenous  Once 05/21/13 2224 05/22/13 0129      Medications: Scheduled Meds: . amLODipine  5 mg Oral Daily  . aspirin EC  81 mg Oral Daily  . enoxaparin (LOVENOX) injection  40 mg Subcutaneous Q24H  . ezetimibe  10 mg Oral q morning - 10a  . insulin aspart  0-5 Units Subcutaneous QHS  . insulin aspart  0-9 Units Subcutaneous TID WC  . lisinopril  20 mg Oral Daily  . loratadine  10 mg Oral Daily  . penicillin g continuous IV infusion  9 Million Units Intravenous Q12H  . sodium chloride  3 mL Intravenous Q12H   Continuous  Infusions: . sodium chloride 75 mL/hr at 05/26/13 0222   PRN Meds:.acetaminophen, acetaminophen, HYDROcodone-acetaminophen, ondansetron (ZOFRAN) IV, ondansetron, phenol    Objective: Weight change: 14.1 oz (0.4 kg)  Intake/Output Summary (Last 24 hours) at 05/26/13 1316 Last data filed at 05/26/13 1230  Gross per 24 hour  Intake    480 ml  Output   2725 ml  Net  -2245 ml   Blood pressure 134/70, pulse 85, temperature 98.9 F (37.2 C), temperature source Oral, resp. rate 18, height 5\' 7"  (1.702 m), weight 170 lb 3.1 oz (77.2 kg), SpO2 95.00%. Temp:  [98.1 F (36.7 C)-99.1 F (37.3 C)] 98.9 F (37.2 C) (04/17 1314) Pulse Rate:  [70-85] 85 (04/17 1314) Resp:  [18-20] 18 (04/17 1314) BP: (115-134)/(56-74) 134/70 mmHg (04/17 1314) SpO2:  [95 %-100 %] 95 % (04/17 1314) Weight:  [170 lb 3.1 oz (77.2 kg)] 170 lb 3.1 oz (77.2 kg) (04/17 0438)  Physical Exam: General: Alert and awake, oriented x3, not in any acute distress.  HEENT: anicteric sclera, pupils reactive to light and accommodation, EOMI, oropharynx clear and without exudate  CVS regular rate, II/vi systolic murmur Chest: clear to auscultation bilaterally, no wheezing, rales or rhonchi  Abdomen: soft  nontender, nondistended, normal bowel sounds,  Extremities: no clubbing or edema noted bilaterally  Skin: no rashes  Neuro: nonfocal, strength and sensation intact   CBC:  Recent Labs Lab 05/21/13 1856 05/22/13 0602 05/23/13 0415 05/24/13 0310  HGB 14.2 12.6* 12.7* 12.9*  HCT 41.8 37.3* 38.0* 38.4*  PLT 222 187 202 206  INR  --  1.15  --   --      BMET  Recent Labs  05/24/13 0310  NA 139  K 3.8  CL 104  CO2 21  GLUCOSE 167*  BUN 13  CREATININE 0.69  CALCIUM 9.3     Liver Panel  No results found for this basename: PROT, ALBUMIN, AST, ALT, ALKPHOS, BILITOT, BILIDIR, IBILI,  in the last 72 hours     Sedimentation Rate No results found for this basename: ESRSEDRATE,  in the last 72  hours C-Reactive Protein No results found for this basename: CRP,  in the last 72 hours  Micro Results: Recent Results (from the past 240 hour(s))  CULTURE, BLOOD (ROUTINE X 2)     Status: None   Collection Time    05/21/13 10:27 PM      Result Value Ref Range Status   Specimen Description BLOOD LEFT ARM   Final   Special Requests BOTTLES DRAWN AEROBIC AND ANAEROBIC [facility] EACH   Final   Culture  Setup Time     Final   Value: 05/22/2013 02:27     Performed at [facility]   Culture     Final   Value: VIRIDANS STREPTOCOCCUS     Note: SUSCEPTIBILITIES PERFORMED ON PREVIOUS CULTURE WITHIN THE LAST 5 DAYS.     Note: Gram Stain Report Called to,Read Back By and Verified With: MARIA LESTER AT 0608 05/23/13 BY SNOLO     Performed at [facility]   Report Status 05/25/2013 FINAL   Final  CULTURE, BLOOD (ROUTINE X 2)     Status: None   Collection Time    05/21/13 10:30 PM      Result Value Ref Range Status   Specimen Description BLOOD RIGHT ARM   Final   Special Requests BOTTLES DRAWN AEROBIC ONLY 5CC   Final   Culture  Setup Time     Final   Value: 05/22/2013 02:27     Performed at [facility]   Culture     Final   Value: VIRIDANS STREPTOCOCCUS     Note: Gram Stain Report Called to,Read Back By and Verified With: MARIA LESTER AT 8:07 P.M. ON 05/22/13 WARRB     Performed at [facility]   Report Status 05/25/2013 FINAL   Final   Organism ID, Bacteria VIRIDANS STREPTOCOCCUS   Final  CULTURE, BLOOD (ROUTINE X 2)     Status: None   Collection Time    05/23/13  7:10 PM      Result Value Ref Range Status   Specimen Description BLOOD RIGHT ARM   Final   Special Requests BOTTLES DRAWN AEROBIC AND ANAEROBIC 10CC EACH   Final   Culture  Setup Time     Final   Value: 05/24/2013 00:56     Performed at [facility]   Culture     Final   Value:        BLOOD CULTURE RECEIVED NO GROWTH TO DATE CULTURE WILL BE HELD FOR 5 DAYS BEFORE ISSUING A FINAL NEGATIVE  REPORT     Performed at [facility]  Report Status PENDING   Incomplete  CULTURE, BLOOD (ROUTINE X 2)     Status: None   Collection Time    05/23/13  7:20 PM      Result Value Ref Range Status   Specimen Description BLOOD RIGHT HAND   Final   Special Requests BOTTLES DRAWN AEROBIC ONLY 10CC   Final   Culture  Setup Time     Final   Value: 05/24/2013 00:57     Performed at [facility]   Culture     Final   Value:        BLOOD CULTURE RECEIVED NO GROWTH TO DATE CULTURE WILL BE HELD FOR 5 DAYS BEFORE ISSUING A FINAL NEGATIVE REPORT     Performed at [facility]   Report Status PENDING   Incomplete    Studies/Results: No results found.    Assessment/Plan:  Active Problems:   FUO (fever of unknown origin)   H/O spinal fusion   H/O Spinal surgery   Bacteremia    Jonathan Blackburn is a 68 y.o. 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 I suspect the organism isolated at [facility] was a contaminant since it was only one blood culture and the PCN MIC was >4.   --he needs 28 days of 10 years penicillin 18 million units per day --fine to place PICC --now we just need weekly cbc and  Bmp faxed to Dr Tommy Medal @ [phone]  Greatly appreciate Dr. Ritta Slot help and agree with fu with dentis and would be aggressive with taking care of any possible nidus of recurrent bacteremia nd endocarditis  ONce he is ready for DC without nephrotoxicity would then dc with home infusion company and make sure that bmp, gentamicin levels faxed to [place] @ [phone]   He should have CD ROMs made of imaging for his Cardiologist with re to his MR and also scan of abdomen for his GI MD so he can have endoscopy to look at area in cecum  I spent well over  40 minutes with the patient including greater than 50% of time in  face to face counsel of the patient and in coordination of their care.  #2 ? Thickening in cecum: see above will need endoscopic evaluation of this area and likely to have with Dr. Benson Norway here in [place]  I will arrange [facility] in the next 2-3 weeks with me  I will sign off for now  Please call with further questions.   LOS: 5 days   Truman Hayward 05/26/2013, 1:16 PM

## 2013-05-26 NOTE — Progress Notes (Signed)
Peripherally Inserted Central Catheter/Midline Placement  The IV Nurse has discussed with the patient and/or persons authorized to consent for the patient, the purpose of this procedure and the potential benefits and risks involved with this procedure.  The benefits include less needle sticks, lab draws from the catheter and patient may be discharged home with the catheter.  Risks include, but not limited to, infection, bleeding, blood clot (thrombus formation), and puncture of an artery; nerve damage and irregular heat beat.  Alternatives to this procedure were also discussed.  PICC/Midline Placement Documentation        Darlyn Read 05/26/2013, 3:55 PM

## 2013-05-26 NOTE — Progress Notes (Signed)
Pt is discharging home with home health services.  HH RN is meeting Pt at house to establish continuous antibx infusion.  All instructions and prescriptions given and reviewed, all questions answered.  Pt also given CDs of CT scan and TEE per request.  Follow up carefully arranged.

## 2013-05-30 ENCOUNTER — Encounter (HOSPITAL_COMMUNITY): Payer: Self-pay | Admitting: Pharmacy Technician

## 2013-05-30 LAB — CULTURE, BLOOD (ROUTINE X 2)
Culture: NO GROWTH
Culture: NO GROWTH

## 2013-06-05 ENCOUNTER — Other Ambulatory Visit: Payer: Self-pay | Admitting: Urology

## 2013-06-06 ENCOUNTER — Encounter (HOSPITAL_COMMUNITY): Payer: Self-pay | Admitting: *Deleted

## 2013-06-06 NOTE — Progress Notes (Signed)
Jonathan Blackburn (wife) did verify with me that the PICC is only being flushed with saline.  She reports no heparin flushes have been done.

## 2013-06-06 NOTE — Progress Notes (Addendum)
I have spoken with Jonathan Blackburn regarding Jonathan Blackburn recent hospital admission for endocarditis (currently being treated for endocarditis at home with continuous IVF w/ PCN), and since this is noted to have occurred from a  tooth abscess, TEE has been done, pt has no cardiac symptoms, then no EKG is needed.

## 2013-06-06 NOTE — Progress Notes (Signed)
Royden Purl (pt's wife) instructed to read over blue folder, pt is to not take any ibuprofen, advil (anti inflammatory's) and no aspirin, or meds containing aspirin within 72hrs of procedure. Mrs. Kuehne verbalized understanding.

## 2013-06-12 ENCOUNTER — Ambulatory Visit (HOSPITAL_COMMUNITY)
Admission: RE | Admit: 2013-06-12 | Discharge: 2013-06-12 | Disposition: A | Discharge status: Home / Self Care | Payer: Medicare Other | Source: Ambulatory Visit | Attending: Urology | Admitting: Urology

## 2013-06-12 ENCOUNTER — Encounter (HOSPITAL_COMMUNITY): Payer: Self-pay | Admitting: *Deleted

## 2013-06-12 ENCOUNTER — Ambulatory Visit (HOSPITAL_COMMUNITY): Payer: Medicare Other

## 2013-06-12 ENCOUNTER — Encounter (HOSPITAL_COMMUNITY)
Admission: RE | Disposition: A | Discharge status: Home / Self Care | Payer: Self-pay | Source: Ambulatory Visit | Attending: Urology

## 2013-06-12 DIAGNOSIS — N200 Calculus of kidney: Secondary | ICD-10-CM | POA: Insufficient documentation

## 2013-06-12 DIAGNOSIS — Z7982 Long term (current) use of aspirin: Secondary | ICD-10-CM | POA: Insufficient documentation

## 2013-06-12 DIAGNOSIS — M129 Arthropathy, unspecified: Secondary | ICD-10-CM | POA: Insufficient documentation

## 2013-06-12 DIAGNOSIS — E119 Type 2 diabetes mellitus without complications: Secondary | ICD-10-CM | POA: Insufficient documentation

## 2013-06-12 DIAGNOSIS — Z79899 Other long term (current) drug therapy: Secondary | ICD-10-CM | POA: Insufficient documentation

## 2013-06-12 DIAGNOSIS — I059 Rheumatic mitral valve disease, unspecified: Secondary | ICD-10-CM | POA: Insufficient documentation

## 2013-06-12 DIAGNOSIS — I10 Essential (primary) hypertension: Secondary | ICD-10-CM | POA: Insufficient documentation

## 2013-06-12 DIAGNOSIS — E7800 Pure hypercholesterolemia, unspecified: Secondary | ICD-10-CM | POA: Insufficient documentation

## 2013-06-12 DIAGNOSIS — Z981 Arthrodesis status: Secondary | ICD-10-CM | POA: Insufficient documentation

## 2013-06-12 HISTORY — PX: LITHOTRIPSY: SUR834

## 2013-06-12 LAB — GLUCOSE, CAPILLARY: Glucose-Capillary: 138 mg/dL — ABNORMAL HIGH (ref 70–99)

## 2013-06-12 SURGERY — LITHOTRIPSY, ESWL
Anesthesia: LOCAL | Laterality: Right

## 2013-06-12 MED ORDER — DIAZEPAM 5 MG PO TABS
10.0000 mg | ORAL_TABLET | ORAL | Status: AC
  Administered 2013-06-12: 10 mg via ORAL
  Filled 2013-06-12: qty 2

## 2013-06-12 MED ORDER — LEVOFLOXACIN 500 MG PO TABS
500.0000 mg | ORAL_TABLET | ORAL | Status: AC
  Administered 2013-06-12: 500 mg via ORAL
  Filled 2013-06-12: qty 1

## 2013-06-12 MED ORDER — DIPHENHYDRAMINE HCL 25 MG PO CAPS
25.0000 mg | ORAL_CAPSULE | ORAL | Status: AC
  Administered 2013-06-12: 25 mg via ORAL
  Filled 2013-06-12: qty 1

## 2013-06-12 MED ORDER — OXYCODONE-ACETAMINOPHEN 5-325 MG PO TABS: 1.0000 | ORAL_TABLET | ORAL | Status: DC | PRN

## 2013-06-12 MED ORDER — DEXTROSE-NACL 5-0.45 % IV SOLN
INTRAVENOUS | Status: DC
  Administered 2013-06-12: 08:00:00 via INTRAVENOUS

## 2013-06-12 NOTE — Progress Notes (Signed)
Pt here today for his lithotripsy and peripheral IV was started in left arm because patient has a PICC in right arm connected to a continuous pump infusion of antibiotics. PICC site has some dried blood around insertion site. Wife states she had called her [facility] Nurse yesterday to report this and was told they would redress it at the scheduled appointment 06/13/13. Pt and wife were a little concerned about the dried blood. Dressing was changed per hospital protocol and because it is not sutured a StatLoc for PICC was placed to secure PICC to the skin. Site is WNL and no bright bleeding noted. Skin under the Tegaderm dressing was pink after removal of tegaderm . Site was cleaned per protocol and skin prep was applied then placed a new tegaderm dressing. Pt was then discharged via w/c accompanied by staff and wife to back door os short stay

## 2013-06-12 NOTE — Discharge Instructions (Signed)
See [facility] discharge instructions in chart.  

## 2013-06-12 NOTE — H&P (Signed)
Urology History and Physical Exam  CC: Kidney stone  HPI: 68 year old male presents for SWL of a 7 mm right renal pelvic stone.  PMH: Past Medical History  Diagnosis Date  . Diabetes mellitus without complication   . Hypertension   . Heart murmur   . Arthritis     PSH: Past Surgical History  Procedure Laterality Date  . Back surgery    . Shoulder arthroscopy      rotator cuff  . Posterior lumbar fusion 4 level N/A 11/16/2012    Procedure: Thoracic seven to thoracic eleven posterior lateral thoracic arthrodesis with instrumentation and bone graft ;  Surgeon: Ophelia Charter, MD;  Location: [place] [facility];  Service: Neurosurgery;  Laterality: N/A;  Thoracic seven to thoracic eleven posterior lateral thoracic arthrodesis with instrumentation and bone graft  . Tee without cardioversion N/A 05/23/2013    Procedure: TRANSESOPHAGEAL ECHOCARDIOGRAM (TEE);  Surgeon: Candee Furbish, MD;  Location: [facility] ENDOSCOPY;  Service: Cardiovascular;  Laterality: N/A;  . Root canal      06/06/13    Allergies: No Known Allergies  Medications: Prescriptions prior to admission  Medication Sig Dispense Refill  . acetaminophen (TYLENOL) 325 MG tablet Take 2 tablets (650 mg total) by mouth every 6 (six) hours as needed for mild pain (or Fever >/= 101).      Marland Kitchen amLODipine (NORVASC) 5 MG tablet Take 5 mg by mouth every morning.       Marland Kitchen aspirin EC 81 MG tablet Take 81 mg by mouth daily.      Marland Kitchen ezetimibe (ZETIA) 10 MG tablet Take 10 mg by mouth every morning.      Marland Kitchen glipiZIDE (GLUCOTROL XL) 5 MG 24 hr tablet Take 5 mg by mouth daily with breakfast.      . lisinopril (PRINIVIL,ZESTRIL) 20 MG tablet Take 20 mg by mouth every morning.       . loratadine (CLARITIN) 10 MG tablet Take 10 mg by mouth daily.      . penicillin G potassium 9 Million Units in dextrose 5 % 500 mL Inject 9 Million Units into the vein every 12 (twelve) hours.  486 Million Units  0     Social History: History   Social History  . Marital  Status: Single    Spouse Name: N/A    Number of Children: N/A  . Years of Education: N/A   Occupational History  . Not on file.   Social History Main Topics  . Smoking status: Never Smoker   . Smokeless tobacco: Not on file  . Alcohol Use: Yes     Comment: rare  . Drug Use: No  . Sexual Activity: Not on file   Other Topics Concern  . Not on file   Social History Narrative  . No narrative on file    Family History: No family history on file.  Review of Systems: Genitourinary, constitutional, skin, eye, otolaryngeal, hematologic/lymphatic, cardiovascular, pulmonary, endocrine, musculoskeletal, gastrointestinal, neurological and psychiatric system(s) were reviewed and pertinent findings if present are noted.  Genitourinary: nocturia, but no hematuria.  Musculoskeletal: back pain and joint pain.             Physical Exam: @VITALS2 @ General: No acute distress.  Awake. Head:  Normocephalic.  Atraumatic. ENT:  EOMI.  Mucous membranes moist Neck:  Supple.  No lymphadenopathy. CV:  S1 present. S2 present. Regular rate. Pulmonary: Equal effort bilaterally.  Clear to auscultation bilaterally. Abdomen: Soft.  Nontender to palpation. Skin:  Normal turgor.  No visible rash. Extremity: No gross deformity of bilateral upper extremities.  No gross deformity of                             lower extremities. Neurologic: Alert. Appropriate mood.    Studies:  No results found for this basename: HGB, WBC, PLT,  in the last 72 hours  No results found for this basename: NA, K, CL, CO2, BUN, CREATININE, CALCIUM, MAGNESIUM, GFRNONAA, GFRAA,  in the last 72 hours   No results found for this basename: PT, INR, APTT,  in the last 72 hours   No components found with this basename: ABG,     Assessment:  7x11 mm right renal pelvic stone--enlarging  Plan: Right ESWL

## 2013-06-15 ENCOUNTER — Ambulatory Visit (INDEPENDENT_AMBULATORY_CARE_PROVIDER_SITE_OTHER): Payer: Medicare Other | Admitting: Infectious Disease

## 2013-06-15 ENCOUNTER — Encounter: Payer: Self-pay | Admitting: Infectious Disease

## 2013-06-15 VITALS — BP 123/77 | HR 78 | Temp 98.0°F | Wt 177.0 lb

## 2013-06-15 DIAGNOSIS — B954 Other streptococcus as the cause of diseases classified elsewhere: Secondary | ICD-10-CM

## 2013-06-15 DIAGNOSIS — K047 Periapical abscess without sinus: Secondary | ICD-10-CM

## 2013-06-15 DIAGNOSIS — A491 Streptococcal infection, unspecified site: Secondary | ICD-10-CM

## 2013-06-15 DIAGNOSIS — K389 Disease of appendix, unspecified: Secondary | ICD-10-CM

## 2013-06-15 DIAGNOSIS — I058 Other rheumatic mitral valve diseases: Secondary | ICD-10-CM

## 2013-06-15 DIAGNOSIS — I059 Rheumatic mitral valve disease, unspecified: Secondary | ICD-10-CM

## 2013-06-15 NOTE — Progress Notes (Signed)
Subjective:    Patient ID: Jonathan Blackburn, male    DOB: 02-08-1946, 68 y.o.   MRN: [id number]  HPI  68 y.o. 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 He turned out to have a very sensitive variety a viridans group streptococcal endocarditis. He is nearly finished his 28 days of therapy from first negative blood culture on 05/23/2013 would last day of continuous penicillin being 06/19/2013 at which point in time the PICC line and removed be removed on the following day 06/20/2013.  He is been seen by his dentist who has performed a root canal after he was found to have an abscess.  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  He saw to followup with Dr. Azalee Course with cardiology and also to followup with Dr. Collene Mares with gastroenterology for consideration of endoscopic evaluation of his appendiceal abnormality   Review of Systems  Constitutional: Negative for fever, chills, diaphoresis, activity change, appetite change, fatigue and unexpected weight change.  HENT: Negative for congestion, rhinorrhea, sinus pressure, sneezing, sore throat and trouble swallowing.   Eyes: Negative for photophobia and visual disturbance.  Respiratory: Negative for cough, chest tightness, shortness of breath, wheezing and stridor.   Cardiovascular: Negative for chest pain, palpitations and leg swelling.  Gastrointestinal: Negative for nausea, vomiting, abdominal pain, diarrhea, constipation, blood in stool, abdominal distention and anal bleeding.  Genitourinary: Negative for dysuria, hematuria, flank pain and difficulty urinating.  Musculoskeletal: Negative for arthralgias, back pain, gait problem, joint swelling and myalgias.  Skin: Negative for color change, pallor, rash and wound.  Neurological: Negative for dizziness, tremors, weakness and  light-headedness.  Hematological: Negative for adenopathy. Does not bruise/bleed easily.  Psychiatric/Behavioral: Negative for behavioral problems, confusion, sleep disturbance, dysphoric mood, decreased concentration and agitation.       Objective:   Physical Exam  Constitutional: He is oriented to person, place, and time. He appears well-developed and well-nourished. No distress.  HENT:  Head: Normocephalic and atraumatic.  Mouth/Throat: Oropharynx is clear and moist. No oropharyngeal exudate.  Eyes: Conjunctivae and EOM are normal. No scleral icterus.  Neck: Normal range of motion. Neck supple. No JVD present.  Cardiovascular: Normal rate, regular rhythm and normal heart sounds.  Exam reveals no gallop and no friction rub.   No murmur heard. Pulmonary/Chest: Effort normal and breath sounds normal. No respiratory distress. He has no wheezes. He has no rales. He exhibits no tenderness.  Abdominal: He exhibits no distension and no mass. There is no tenderness. There is no rebound and no guarding.  Musculoskeletal: He exhibits no edema and no tenderness.  Lymphadenopathy:    He has no cervical adenopathy.  Neurological: He is alert and oriented to person, place, and time. He exhibits normal muscle tone. Coordination normal.  Skin: Skin is warm and dry. He is not diaphoretic. No erythema. No pallor.  Psychiatric: He has a normal mood and affect. His behavior is normal. Judgment and thought content normal.   Skin: PICC is clean:            Assessment & Plan:   Viridans Streptococcal Mitral valve endocarditis:  --Complete 28 days of IV penicillin on 06/19/2013 and discontinue his PICC line on 06/20/2013  --Check surveillance blood cultures approximately 2 weeks after last day of antibiotics  I spent greater than 25 minutes with the patient including greater  than 50% of time in face to face counsel of the patient and in coordination of their care.   Appendiceal abnormality:  Should have endoscopic evaluation and is going to see Dr. Man.

## 2013-06-16 ENCOUNTER — Encounter (HOSPITAL_COMMUNITY)
Admission: EM | Disposition: A | Discharge status: Home / Self Care | Payer: Self-pay | Source: Ambulatory Visit | Attending: Urology

## 2013-06-16 ENCOUNTER — Other Ambulatory Visit: Payer: Self-pay | Admitting: Urology

## 2013-06-16 ENCOUNTER — Encounter (HOSPITAL_COMMUNITY): Payer: Medicare Other | Admitting: Anesthesiology

## 2013-06-16 ENCOUNTER — Ambulatory Visit (HOSPITAL_COMMUNITY): Payer: Medicare Other | Admitting: Anesthesiology

## 2013-06-16 ENCOUNTER — Encounter (HOSPITAL_COMMUNITY): Payer: Self-pay | Admitting: *Deleted

## 2013-06-16 ENCOUNTER — Ambulatory Visit (HOSPITAL_COMMUNITY)
Admission: EM | Admit: 2013-06-16 | Discharge: 2013-06-16 | Disposition: A | Discharge status: Home / Self Care | Payer: Medicare Other | Source: Ambulatory Visit | Attending: Urology | Admitting: Urology

## 2013-06-16 DIAGNOSIS — I10 Essential (primary) hypertension: Secondary | ICD-10-CM | POA: Insufficient documentation

## 2013-06-16 DIAGNOSIS — I059 Rheumatic mitral valve disease, unspecified: Secondary | ICD-10-CM | POA: Insufficient documentation

## 2013-06-16 DIAGNOSIS — N201 Calculus of ureter: Secondary | ICD-10-CM | POA: Insufficient documentation

## 2013-06-16 DIAGNOSIS — E119 Type 2 diabetes mellitus without complications: Secondary | ICD-10-CM | POA: Insufficient documentation

## 2013-06-16 DIAGNOSIS — N200 Calculus of kidney: Secondary | ICD-10-CM | POA: Insufficient documentation

## 2013-06-16 HISTORY — PX: CYSTOSCOPY WITH RETROGRADE PYELOGRAM, URETEROSCOPY AND STENT PLACEMENT: SHX5789

## 2013-06-16 LAB — CBC
HCT: 45.7 % (ref 39.0–52.0)
Hemoglobin: 15.2 g/dL (ref 13.0–17.0)
MCH: 27.2 pg (ref 26.0–34.0)
MCHC: 33.3 g/dL (ref 30.0–36.0)
MCV: 81.8 fL (ref 78.0–100.0)
PLATELETS: 236 10*3/uL (ref 150–400)
RBC: 5.59 MIL/uL (ref 4.22–5.81)
RDW: 14.8 % (ref 11.5–15.5)
WBC: 11.7 10*3/uL — AB (ref 4.0–10.5)

## 2013-06-16 LAB — BASIC METABOLIC PANEL
BUN: 21 mg/dL (ref 6–23)
CALCIUM: 10 mg/dL (ref 8.4–10.5)
CO2: 18 meq/L — AB (ref 19–32)
CREATININE: 0.9 mg/dL (ref 0.50–1.35)
Chloride: 103 mEq/L (ref 96–112)
GFR calc Af Amer: >90 mL/min (ref >90)
GFR, EST NON AFRICAN AMERICAN: 86 mL/min — AB (ref >90)
Glucose, Bld: 150 mg/dL — ABNORMAL HIGH (ref 70–99)
Potassium: 4.3 mEq/L (ref 3.7–5.3)
SODIUM: 138 meq/L (ref 137–147)

## 2013-06-16 LAB — GLUCOSE, CAPILLARY
GLUCOSE-CAPILLARY: 142 mg/dL — AB (ref 70–99)
GLUCOSE-CAPILLARY: 148 mg/dL — AB (ref 70–99)

## 2013-06-16 SURGERY — CYSTOURETEROSCOPY, WITH RETROGRADE PYELOGRAM AND STENT INSERTION
Anesthesia: General | Site: Pelvis | Laterality: Right

## 2013-06-16 MED ORDER — LIDOCAINE HCL 2 % EX GEL
CUTANEOUS | Status: AC
  Filled 2013-06-16: qty 10

## 2013-06-16 MED ORDER — OXYBUTYNIN CHLORIDE 5 MG PO TABS: 5.0000 mg | ORAL_TABLET | Freq: Three times a day (TID) | ORAL | Status: DC | PRN

## 2013-06-16 MED ORDER — SUCCINYLCHOLINE CHLORIDE 20 MG/ML IJ SOLN
INTRAMUSCULAR | Status: DC | PRN
  Administered 2013-06-16: 100 mg via INTRAVENOUS

## 2013-06-16 MED ORDER — CIPROFLOXACIN IN D5W 400 MG/200ML IV SOLN
INTRAVENOUS | Status: AC
  Filled 2013-06-16: qty 200

## 2013-06-16 MED ORDER — CIPROFLOXACIN IN D5W 400 MG/200ML IV SOLN
400.0000 mg | INTRAVENOUS | Status: AC
  Administered 2013-06-16: 400 mg via INTRAVENOUS

## 2013-06-16 MED ORDER — MIDAZOLAM HCL 5 MG/5ML IJ SOLN
INTRAMUSCULAR | Status: DC | PRN
  Administered 2013-06-16: 2 mg via INTRAVENOUS

## 2013-06-16 MED ORDER — SENNOSIDES-DOCUSATE SODIUM 8.6-50 MG PO TABS: 1.0000 | ORAL_TABLET | Freq: Two times a day (BID) | ORAL | Status: DC

## 2013-06-16 MED ORDER — MIDAZOLAM HCL 2 MG/2ML IJ SOLN
INTRAMUSCULAR | Status: AC
  Filled 2013-06-16: qty 2

## 2013-06-16 MED ORDER — IOHEXOL 300 MG/ML  SOLN
INTRAMUSCULAR | Status: DC | PRN
  Administered 2013-06-16: 30 mL via INTRAVENOUS

## 2013-06-16 MED ORDER — FAMOTIDINE IN NACL 20-0.9 MG/50ML-% IV SOLN
20.0000 mg | Freq: Once | INTRAVENOUS | Status: AC
  Administered 2013-06-16: 20 mg via INTRAVENOUS
  Filled 2013-06-16: qty 50

## 2013-06-16 MED ORDER — OXYBUTYNIN CHLORIDE 5 MG PO TABS
ORAL_TABLET | ORAL | Status: AC
  Administered 2013-06-16: 5 mg
  Filled 2013-06-16: qty 1

## 2013-06-16 MED ORDER — LIDOCAINE HCL (CARDIAC) 20 MG/ML IV SOLN
INTRAVENOUS | Status: DC | PRN
  Administered 2013-06-16: 80 mg via INTRAVENOUS

## 2013-06-16 MED ORDER — FENTANYL CITRATE 0.05 MG/ML IJ SOLN
INTRAMUSCULAR | Status: AC
  Filled 2013-06-16: qty 5

## 2013-06-16 MED ORDER — EPHEDRINE SULFATE 50 MG/ML IJ SOLN
INTRAMUSCULAR | Status: DC | PRN
  Administered 2013-06-16 (×3): 5 mg via INTRAVENOUS

## 2013-06-16 MED ORDER — GENTAMICIN SULFATE 40 MG/ML IJ SOLN
400.0000 mg | INTRAVENOUS | Status: AC
  Administered 2013-06-16: 400 mg via INTRAVENOUS
  Filled 2013-06-16: qty 10

## 2013-06-16 MED ORDER — FENTANYL CITRATE 0.05 MG/ML IJ SOLN
INTRAMUSCULAR | Status: DC | PRN
  Administered 2013-06-16 (×6): 50 ug via INTRAVENOUS

## 2013-06-16 MED ORDER — LACTATED RINGERS IV SOLN
INTRAVENOUS | Status: DC | PRN
  Administered 2013-06-16 (×2): via INTRAVENOUS

## 2013-06-16 MED ORDER — PROPOFOL 10 MG/ML IV BOLUS
INTRAVENOUS | Status: DC | PRN
  Administered 2013-06-16: 200 mg via INTRAVENOUS

## 2013-06-16 MED ORDER — ONDANSETRON HCL 4 MG/2ML IJ SOLN
INTRAMUSCULAR | Status: DC | PRN
  Administered 2013-06-16: 4 mg via INTRAVENOUS

## 2013-06-16 MED ORDER — OXYCODONE-ACETAMINOPHEN 5-325 MG PO TABS: 1.0000 | ORAL_TABLET | Freq: Four times a day (QID) | ORAL | Status: DC | PRN

## 2013-06-16 MED ORDER — BELLADONNA ALKALOIDS-OPIUM 16.2-60 MG RE SUPP
RECTAL | Status: AC
  Filled 2013-06-16: qty 1

## 2013-06-16 SURGICAL SUPPLY — 22 items
BAG URINE DRAINAGE (UROLOGICAL SUPPLIES) ×3 IMPLANT
BASKET LASER NITINOL 1.9FR (BASKET) ×3 IMPLANT
BASKET STNLS GEMINI 4WIRE 3FR (BASKET) IMPLANT
BASKET ZERO TIP NITINOL 2.4FR (BASKET) IMPLANT
CATH INTERMIT  6FR 70CM (CATHETERS) ×3 IMPLANT
CLOTH BEACON ORANGE TIMEOUT ST (SAFETY) ×3 IMPLANT
DRAPE CAMERA CLOSED 9X96 (DRAPES) ×3 IMPLANT
ELECT REM PT RETURN 9FT ADLT (ELECTROSURGICAL)
ELECTRODE REM PT RTRN 9FT ADLT (ELECTROSURGICAL) IMPLANT
FIBER LASER FLEXIVA 200 (UROLOGICAL SUPPLIES) IMPLANT
FIBER LASER FLEXIVA 365 (UROLOGICAL SUPPLIES) IMPLANT
GLOVE BIOGEL M STRL SZ7.5 (GLOVE) ×3 IMPLANT
GOWN STRL REUS W/TWL LRG LVL3 (GOWN DISPOSABLE) ×3 IMPLANT
GUIDEWIRE ANG ZIPWIRE 038X150 (WIRE) ×3 IMPLANT
GUIDEWIRE STR DUAL SENSOR (WIRE) ×3 IMPLANT
IV NS IRRIG 3000ML ARTHROMATIC (IV SOLUTION) ×3 IMPLANT
PACK CYSTO (CUSTOM PROCEDURE TRAY) ×3 IMPLANT
SHEATH ACCESS URETERAL 38CM (SHEATH) ×3 IMPLANT
STENT POLARIS 5FRX24 (STENTS) ×3 IMPLANT
SYRINGE 10CC LL (SYRINGE) IMPLANT
SYRINGE IRR TOOMEY STRL 70CC (SYRINGE) IMPLANT
TUBE FEEDING 8FR 16IN STR KANG (MISCELLANEOUS) ×3 IMPLANT

## 2013-06-16 NOTE — H&P (Signed)
Jonathan Blackburn is an 68 y.o. male.    Chief Complaint: Pre-Op Rt Ureteroscopic Stone Manipulation  HPI:  1 - Rt Ureteral Stone - PT with symptomatic 75mm Rt UPJ stone s/p SWL 06/12/2013 but with persistant flank pain, poor PO intake, and KUB with persistance of several fragments Rt proximal ureter with hydro by office Eval. 06/16/13.   No fevers. UA without infectious parameters.  After consideration of options, pt wants to proceed with rt ureteroscopic stone manipulation today for his refractory symptoms.   Past Medical History  Diagnosis Date  . Diabetes mellitus without complication   . Hypertension   . Heart murmur   . Arthritis     Past Surgical History  Procedure Laterality Date  . Back surgery    . Shoulder arthroscopy      rotator cuff  . Posterior lumbar fusion 4 level N/A 11/16/2012    Procedure: Thoracic seven to thoracic eleven posterior lateral thoracic arthrodesis with instrumentation and bone graft ;  Surgeon: Ophelia Charter, MD;  Location: [place] NEURO ORS;  Service: Neurosurgery;  Laterality: N/A;  Thoracic seven to thoracic eleven posterior lateral thoracic arthrodesis with instrumentation and bone graft  . Tee without cardioversion N/A 05/23/2013    Procedure: TRANSESOPHAGEAL ECHOCARDIOGRAM (TEE);  Surgeon: Candee Furbish, MD;  Location: [facility] ENDOSCOPY;  Service: Cardiovascular;  Laterality: N/A;  . Root canal      06/06/13  . Lithotripsy Right 06/12/13    Dr Diona Fanti    History reviewed. No pertinent family history. Social History:  reports that he has never smoked. He does not have any smokeless tobacco history on file. He reports that he drinks alcohol. He reports that he does not use illicit drugs.  Allergies: No Known Allergies  Medications Prior to Admission  Medication Sig Dispense Refill  . acetaminophen (TYLENOL) 325 MG tablet Take 2 tablets (650 mg total) by mouth every 6 (six) hours as needed for mild pain (or Fever >/= 101).      Marland Kitchen amLODipine (NORVASC) 5 MG  tablet Take 5 mg by mouth every morning.       Marland Kitchen aspirin EC 81 MG tablet Take 81 mg by mouth daily.      Marland Kitchen ezetimibe (ZETIA) 10 MG tablet Take 10 mg by mouth every morning.      Marland Kitchen glipiZIDE (GLUCOTROL XL) 5 MG 24 hr tablet Take 5 mg by mouth daily with breakfast.      . lisinopril (PRINIVIL,ZESTRIL) 20 MG tablet Take 20 mg by mouth every morning.       . loratadine (CLARITIN) 10 MG tablet Take 10 mg by mouth daily.      Marland Kitchen oxyCODONE-acetaminophen (ROXICET) 5-325 MG per tablet Take 1-2 tablets by mouth every 4 (four) hours as needed for severe pain.  30 tablet  0  . penicillin G potassium 9 Million Units in dextrose 5 % 500 mL Inject 9 Million Units into the vein every 12 (twelve) hours.  486 Million Units  0    Results for orders placed during the hospital encounter of 06/16/13 (from the past 48 hour(s))  CBC     Status: Abnormal   Collection Time    06/16/13  5:05 PM      Result Value Ref Range   WBC 11.7 (*) 4.0 - 10.5 K/uL   RBC 5.59  4.22 - 5.81 MIL/uL   Hemoglobin 15.2  13.0 - 17.0 g/dL   HCT 45.7  39.0 - 52.0 %   MCV 81.8  78.0 -  100.0 fL   MCH 27.2  26.0 - 34.0 pg   MCHC 33.3  30.0 - 36.0 g/dL   RDW 14.8  11.5 - 15.5 %   Platelets 236  150 - 400 K/uL  GLUCOSE, CAPILLARY     Status: Abnormal   Collection Time    06/16/13  5:08 PM      Result Value Ref Range   Glucose-Capillary 142 (*) 70 - 99 mg/dL   Comment 1 Documented in Chart     No results found.  Review of Systems  Constitutional: Negative.  Negative for fever and chills.  HENT: Negative.   Eyes: Negative.   Respiratory: Negative.   Cardiovascular: Negative.   Gastrointestinal: Negative.   Genitourinary: Positive for flank pain.  Musculoskeletal: Negative.   Skin: Negative.   Neurological: Negative.   Endo/Heme/Allergies: Negative.   Psychiatric/Behavioral: Negative.     Blood pressure 132/77, pulse 86, temperature 97.8 F (36.6 C), temperature source Oral, resp. rate 16, SpO2 97.00%. Physical Exam   Constitutional: He is oriented to person, place, and time. He appears well-developed and well-nourished.  HENT:  Head: Normocephalic and atraumatic.  Eyes: Pupils are equal, round, and reactive to light.  Neck: Normal range of motion. Neck supple.  Cardiovascular: Normal rate.   Respiratory: Effort normal.  GI: Soft. Bowel sounds are normal.  Genitourinary:  Moderate Rt CVAT  Musculoskeletal: Normal range of motion.  Neurological: He is alert and oriented to person, place, and time.  Skin: Skin is warm and dry.  Psychiatric: He has a normal mood and affect. His behavior is normal. Judgment and thought content normal.     Assessment/Plan  1 - Rt Ureteral Stone - Discussed options of medical therapy v. Ureteroscopy and he wants the latter.  We discussed ureteroscopic stone manipulation with basketing and laser-lithotripsy in detail.  We discussed risks including bleeding, infection, damage to kidney / ureter  bladder, rarely loss of kidney. We discussed anesthetic risks and rare but serious surgical complications including DVT, PE, MI, and mortality. We specifically addressed that in 5-10% of cases a staged approach is required with stenting followed by re-attempt ureteroscopy if anatomy unfavorable. The patient voiced understanding and wises to proceed.   Alexis Frock 06/16/2013, 5:25 PM

## 2013-06-16 NOTE — Anesthesia Postprocedure Evaluation (Signed)
  Anesthesia Post-op Note  Patient: Occupational psychologist  Procedure(s) Performed: Procedure(s): CYSTOSCOPY WITH RETROGRADE PYELOGRAM, URETEROSCOPY AND STENT PLACEMENT, AND STONE EXTRACTION RIGHT URETER (Right)  Patient Location: PACU  Anesthesia Type:General  Level of Consciousness: awake, alert , oriented and patient cooperative  Airway and Oxygen Therapy: Patient Spontanous Breathing  Post-op Pain: mild  Post-op Assessment: Post-op Vital signs reviewed, Patient's Cardiovascular Status Stable, Respiratory Function Stable, Patent Airway, No signs of Nausea or vomiting and Pain level controlled  Post-op Vital Signs: Reviewed and stable  Last Vitals:  Filed Vitals:   06/16/13 2111  BP:   Pulse:   Temp: 36.5 C  Resp: 19    Complications: No apparent anesthesia complications

## 2013-06-16 NOTE — Brief Op Note (Signed)
06/16/2013  8:58 PM  PATIENT:  Jonathan Blackburn  68 y.o. male  PRE-OPERATIVE DIAGNOSIS:  Right ureteral calculus  POST-OPERATIVE DIAGNOSIS:  right ureteral calculi  PROCEDURE:  Procedure(s): CYSTOSCOPY WITH RETROGRADE PYELOGRAM, URETEROSCOPY AND STENT PLACEMENT, AND STONE EXTRACTION RIGHT URETER (Right)  SURGEON:  Surgeon(s) and Role:    * Alexis Frock, MD - Primary  PHYSICIAN ASSISTANT:   ASSISTANTS: none   ANESTHESIA:   general  EBL:     BLOOD ADMINISTERED:none  DRAINS: none   LOCAL MEDICATIONS USED:  NONE  SPECIMEN:  Source of Specimen:  Rt Renal and Ureteral stone fragments  DISPOSITION OF SPECIMEN:  Given to patient  COUNTS:  YES  TOURNIQUET:  * No tourniquets in log *  DICTATION: .Other Dictation: Dictation Number [id number]  PLAN OF CARE: Discharge to home after PACU  PATIENT DISPOSITION:  PACU - hemodynamically stable.   Delay start of Pharmacological VTE agent (>24hrs) due to surgical blood loss or risk of bleeding: no

## 2013-06-16 NOTE — Discharge Instructions (Signed)
1 - You may have urinary urgency (bladder spasms) and bloody urine on / off with stent in place. This is normal.  2 - Call MD or go to ER for fever >102, severe pain / nausea / vomiting not relieved by medications, or acute change in medical status  3 - Removed tethered stent on Monday morning at home by pulling on string, then blue/white plastic and discarding. Cystoscopy, Care After Refer to this sheet in the next few weeks. These instructions provide you with information on caring for yourself after your procedure. Your caregiver may also give you more specific instructions. Your treatment has been planned according to current medical practices, but problems sometimes occur. Call your caregiver if you have any problems or questions after your procedure. HOME CARE INSTRUCTIONS  Things you can do to ease any discomfort after your procedure include:  Drinking enough water and fluids to keep your urine clear or pale yellow.  Taking a warm bath to relieve any burning feelings. SEEK IMMEDIATE MEDICAL CARE IF:   You have an increase in blood in your urine.  You notice blood clots in your urine.  You have difficulty passing urine.  You have the chills.  You have abdominal pain.  You have a fever or persistent symptoms for more than 2 3 days.  You have a fever and your symptoms suddenly get worse. MAKE SURE YOU:   Understand these instructions.  Will watch your condition.  Will get help right away if you are not doing well or get worse. Document Released: 08/15/2004 Document Revised: 09/28/2012 Document Reviewed: 07/20/2011 [facility] Patient Information 2014 [place].

## 2013-06-16 NOTE — Transfer of Care (Signed)
Immediate Anesthesia Transfer of Care Note  Patient: Occupational psychologist  Procedure(s) Performed: Procedure(s) (LRB): CYSTOSCOPY WITH RETROGRADE PYELOGRAM, URETEROSCOPY AND STENT PLACEMENT, AND STONE EXTRACTION RIGHT URETER (Right)  Patient Location: PACU  Anesthesia Type: General  Level of Consciousness: sedated, patient cooperative and responds to stimulation  Airway & Oxygen Therapy: Patient Spontanous Breathing and Patient connected to face mask oxgen  Post-op Assessment: Report given to PACU RN and Post -op Vital signs reviewed and stable  Post vital signs: Reviewed and stable  Complications: No apparent anesthesia complications

## 2013-06-16 NOTE — Anesthesia Preprocedure Evaluation (Addendum)
Anesthesia Evaluation  Patient identified by MRN, date of birth, ID band Patient awake    Reviewed: Allergy & Precautions, H&P , NPO status , Patient's Chart, lab work & pertinent test results  Airway Mallampati: II TM Distance: >3 FB Neck ROM: Full    Dental no notable dental hx.    Pulmonary neg pulmonary ROS,  breath sounds clear to auscultation  Pulmonary exam normal       Cardiovascular hypertension, Pt. on medications + Valvular Problems/Murmurs MR Rhythm:Regular Rate:Normal + Systolic murmurs 68 y.o. 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 He turned out to have a very sensitive variety a viridans group streptococcal endocarditis. He is nearly finished his 28 days of therapy from first negative blood culture on 05/23/2013 would last day of continuous penicillin being 06/19/2013 at which point in time the PICC line and removed be removed on the following day 06/20/2013.      Neuro/Psych negative neurological ROS  negative psych ROS   GI/Hepatic negative GI ROS, Neg liver ROS,   Endo/Other  diabetes, Oral Hypoglycemic Agents  Renal/GU negative Renal ROS  negative genitourinary   Musculoskeletal negative musculoskeletal ROS (+)   Abdominal   Peds negative pediatric ROS (+)  Hematology negative hematology ROS (+)   Anesthesia Other Findings   Reproductive/Obstetrics negative OB ROS                          Anesthesia Physical Anesthesia Plan  ASA: III  Anesthesia Plan: General   Post-op Pain Management:    Induction: Intravenous  Airway Management Planned: Oral ETT  Additional Equipment:   Intra-op Plan:   Post-operative Plan: Extubation in OR  Informed Consent: I have reviewed the patients History and Physical, chart, labs and discussed the procedure including the risks, benefits and alternatives for the proposed anesthesia with the  patient or authorized representative who has indicated his/her understanding and acceptance.   Dental advisory given  Plan Discussed with: CRNA and Surgeon  Anesthesia Plan Comments:         Anesthesia Quick Evaluation

## 2013-06-17 NOTE — Op Note (Signed)
NAMEDELFINO, FRIESEN NO.:  [id number]  MEDICAL RECORD NO.:  [id number]  LOCATION:  [facility]                         FACILITY:  [facility]  PHYSICIAN:  Alexis Frock, MD     DATE OF BIRTH:  05-Oct-1945  DATE OF PROCEDURE: 06/16/2013 DATE OF DISCHARGE:                              OPERATIVE REPORT   DIAGNOSIS:  Right renal and ureteral stones, refractory flank pain and steinstrasse.  PROCEDURE: 1. Cystoscopy right retrograde pyelogram with interpretation. 2. Right ureteroscopy with basketing of stones. 3. Insertion of right ureteral stent 5 x 24 with tether to dorsum of     the penis.  ESTIMATED BLOOD LOSS:  Nil.  COMPLICATIONS:  None.  SPECIMEN:  Right renal and ureteral stone fragments.  FINDINGS:  Multifocal right renal and ureteral stones including distal right ureteral steinstrasse and multifocal right renal stone fragments, largest of which approximately 3.5 mm.  INDICATION:  Mr. Nakamura is a pleasant 68 year old gentleman with history of recurrent nephrolithiasis.  He underwent shockwave lithotripsy several days ago.  He presented to the office today with refractory right renal colic.  He denies fevers or infectious parameters and his urinalysis options were discussed including operative intervention with ureteroscopy, with stone manipulation and wished to proceed.  Informed consent was obtained and placed in medical record. Notably, the patient has had a 8-hour interval since eating.  PROCEDURE IN DETAIL:  The patient being Occupational psychologist verified. Procedure being right ureteroscopic stone manipulation was confirmed. Procedure was carried out.  Time-out was performed.  Intravenous antibiotics were administered.  General LMA anesthesia was introduced. The patient placed into a low lithotomy position.  Sterile field was created prepping and draping the patient's penis, perineum, and proximal thighs using iodine x3.  Next, cystourethroscopy was performed using  a 22-French rigid cystoscope with 12-degree offset lens.  Inspection of the anterior and posterior urethra unremarkable.  Inspection of urinary bladder revealed no diverticula, calcifications, papular lesions.  The right ureteral orifice was cannulated with a 6-French catheter and right retrograde pyelogram was obtained.  Right retrograde pyelogram demonstrates a single right ureter, single system right kidney.  There was a filling defect at the UPJ consistent with probable stone fragments with multiple filling defects in the proximal ureter as well as in the renal pelvis.  A 0.03 Glidewire was advanced at the level of the upper pole and set aside as a safety wire. Next, semi-rigid ureteroscopy was performed using a 6-French digital ureteroscope alongside a separate Sensor working wire and an 8-French feeding tube in urinary bladder for pressure release.  Indeed at the area of the UVJ, there was several stones consistent with steinstrasse, these were individually grasped with escape basket and brought to the level of the urinary bladder.  Inspection of the ureter revealed no additional stones.  The semi-rigid ureteroscope was exchanged for a 38 cm 12/14 ureteral access sheath at the level of proximal ureter over the sensor working wire.  Next, flexible digital ureteroscopy was performed, 8-French digital ureteroscope is allowed inspection of the proximal ureter, and systematic inspection the right kidney.  There are several other stone fragments in the proximal ureter and these were also grasped and brought  out in their entirety, and there multifocal stones within the renal pelvis, largest of which is 3.5 mm and all poles of the kidney.  These were each grasped and removed in entirety.  Following these maneuvers, all stones larger than 1/3 mm had been successfully removed.  There was no evidence of renal perforation.  There was excellent hemostasis.  The ureteral access sheath was  removed under continuous ureteroscopic vision.  No mucosal abnormalities were found. Finally, a new 5 x 24 Polaris-type stent was placed with remaining safety wire using fluoroscopic guidance.  Good proximal and distal deployment were noted.  Tether was fashioned, dorsum of the penis. Procedure was then terminated.  The patient tolerated the procedure well.  There were no immediate periprocedural complications.  The patient was taken to the postanesthesia care in stable condition.          ______________________________ Alexis Frock, MD     TM/MEDQ  D:  06/16/2013  T:  06/17/2013  Job:  [id number]

## 2013-06-19 ENCOUNTER — Encounter (HOSPITAL_COMMUNITY): Payer: Self-pay | Admitting: Urology

## 2013-06-22 ENCOUNTER — Encounter: Payer: Self-pay | Admitting: Cardiology

## 2013-06-22 ENCOUNTER — Ambulatory Visit (INDEPENDENT_AMBULATORY_CARE_PROVIDER_SITE_OTHER): Payer: Medicare Other | Admitting: Cardiology

## 2013-06-22 VITALS — BP 118/75 | HR 82 | Ht 68.0 in | Wt 170.0 lb

## 2013-06-22 DIAGNOSIS — B954 Other streptococcus as the cause of diseases classified elsewhere: Secondary | ICD-10-CM

## 2013-06-22 DIAGNOSIS — I058 Other rheumatic mitral valve diseases: Secondary | ICD-10-CM | POA: Insufficient documentation

## 2013-06-22 DIAGNOSIS — I340 Nonrheumatic mitral (valve) insufficiency: Secondary | ICD-10-CM | POA: Insufficient documentation

## 2013-06-22 DIAGNOSIS — I10 Essential (primary) hypertension: Secondary | ICD-10-CM

## 2013-06-22 DIAGNOSIS — I059 Rheumatic mitral valve disease, unspecified: Secondary | ICD-10-CM

## 2013-06-22 DIAGNOSIS — IMO0001 Reserved for inherently not codable concepts without codable children: Secondary | ICD-10-CM

## 2013-06-22 DIAGNOSIS — A491 Streptococcal infection, unspecified site: Secondary | ICD-10-CM

## 2013-06-22 DIAGNOSIS — R7881 Bacteremia: Secondary | ICD-10-CM

## 2013-06-22 DIAGNOSIS — K047 Periapical abscess without sinus: Secondary | ICD-10-CM

## 2013-06-22 DIAGNOSIS — E1165 Type 2 diabetes mellitus with hyperglycemia: Secondary | ICD-10-CM

## 2013-06-22 NOTE — Patient Instructions (Signed)
Your physician recommends that you continue on your current medications as directed. Please refer to the Current Medication list given to you today.  Your physician wants you to follow-up in: 6 months with Dr. Skains. You will receive a reminder letter in the mail two months in advance. If you don't receive a letter, please call our office to schedule the follow-up appointment.  

## 2013-06-22 NOTE — Progress Notes (Signed)
[facility] [address], [address], [place]  [address] Phone: [phone] Fax:  [phone]  Date:  06/22/2013   ID:  [name], DOB [birth year], MRN [id number]  PCP:  Fae Pippin   History of Present Illness: Jonathan Blackburn is a 68 y.o. 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 Tommy Medal, with plan to discontinue PIC line on 06/20/13, check surveillance blood cultures 2 weeks after antibiotic per infectious disease here for followup.  Possible source of infection was left lower molar dental abscess but oral surgery did not feel that this was significant. However just had root canal. Gastroenterology workup pending as outpatient for thickening of appendix (Dr. Collene Mares). Echocardiogram showed mobile vegetation with mild mitral regurgitation on transesophageal echocardiogram 05/26/13.  Renal stone. Large. Stent.    Wt Readings from Last 3 Encounters:  06/22/13 170 lb (77.111 kg)  06/15/13 177 lb (80.287 kg)  06/12/13 170 lb (77.111 kg)     Past Medical History  Diagnosis Date  . Diabetes mellitus without complication   . Hypertension   . Heart murmur   . Arthritis   . Calculus of gallbladder without mention of cholecystitis or obstruction   . Thoracic or lumbosacral neuritis or radiculitis, unspecified   . Calculus of kidney   . Inguinal hernia without mention of obstruction or gangrene, bilateral, (not specified as recurrent)   . Other chronic nonalcoholic liver disease   . Closed fracture of multiple ribs, unspecified   . Closed fracture of dorsal (thoracic) vertebra without mention of spinal cord injury   . Benign neoplasm of colon   . Impotence of organic origin   . Pure hypercholesterolemia   . Mitral valve disorders   . Diverticulosis of colon (without mention of hemorrhage)     Past Surgical History  Procedure Laterality Date  . Back surgery    . Shoulder arthroscopy      rotator cuff  . Posterior lumbar fusion 4 level N/A  11/16/2012    Procedure: Thoracic seven to thoracic eleven posterior lateral thoracic arthrodesis with instrumentation and bone graft ;  Surgeon: Ophelia Charter, MD;  Location: [facility];  Service: Neurosurgery;  Laterality: N/A;  Thoracic seven to thoracic eleven posterior lateral thoracic arthrodesis with instrumentation and bone graft  . Tee without cardioversion N/A 05/23/2013    Procedure: TRANSESOPHAGEAL ECHOCARDIOGRAM (TEE);  Surgeon: Candee Furbish, MD;  Location: [facility];  Service: Cardiovascular;  Laterality: N/A;  . Root canal      06/06/13  . Lithotripsy Right 06/12/13    Dr Diona Fanti  . Cystoscopy with retrograde pyelogram, ureteroscopy and stent placement Right 06/16/2013    Procedure: CYSTOSCOPY WITH RETROGRADE PYELOGRAM, URETEROSCOPY AND STENT PLACEMENT, AND STONE EXTRACTION RIGHT URETER;  Surgeon: Alexis Frock, MD;  Location: [facility];  Service: Urology;  Laterality: Right;  . Adenoidectomy    . Tonsillectomy      Current Outpatient Prescriptions  Medication Sig Dispense Refill  . amLODipine (NORVASC) 5 MG tablet Take 5 mg by mouth every morning.       Marland Kitchen aspirin EC 81 MG tablet Take 81 mg by mouth daily.      Marland Kitchen ezetimibe (ZETIA) 10 MG tablet Take 10 mg by mouth every morning.      Marland Kitchen glipiZIDE (GLUCOTROL XL) 5 MG 24 hr tablet Take 5 mg by mouth daily with breakfast.      . HYDROcodone-acetaminophen (NORCO) 10-325 MG per tablet Take 1 tablet by mouth every  6 (six) hours as needed.      Marland Kitchen lisinopril (PRINIVIL,ZESTRIL) 20 MG tablet Take 20 mg by mouth every morning.       . OXYBUTYNIN CHLORIDE PO Take by mouth. Take 5mg  1 tab every 8 hours as needed       No current facility-administered medications for this visit.    Allergies:   No Known Allergies  Social History:  The patient  reports that he has never smoked. He does not have any smokeless tobacco history on file. He reports that he drinks alcohol. He reports that he does not use illicit drugs.   Family History    Problem Relation Age of Onset  . Hypertension Mother   . Diabetes Mellitus II Mother   . Cancer Mother   . Dementia Mother   . Coronary artery disease Father   . Cerebrovascular Accident Father     ROS:  Please see the history of present illness.   Denies any recurrent fevers, chest pain, syncope, stroke like symptoms, shortness of breath   All other systems reviewed and negative.   PHYSICAL EXAM: VS:  BP 118/75  Pulse 82  Ht 5\' 8"  (1.727 m)  Wt 170 lb (77.111 kg)  BMI 25.85 kg/m2 Well nourished, well developed, in no acute distress HEENT: normal, [place] Ruiz/AT, EOMI Neck: no JVD, normal carotid upstroke, no bruit Cardiac:  normal S1, S2; RRR; 2/6 systolic murmurApex Lungs:  clear to auscultation bilaterally, no wheezing, rhonchi or rales Abd: soft, nontender, no hepatomegaly, no bruits Ext: no edema, 2+ distal pulses Skin: warm and dryNo endocarditis sequelae GU: deferred Neuro: no focal abnormalities noted, AAO x 3  EKG:  Sinus rhythm, 82, rightward axis, nonspecific ST changes, artifact.    TEE: 05/23/13 -  - Mitral valve: There was a small, 36mm (L) x 85mm (W), strand-like, mobile vegetation on the atrial aspect of the body of the posterior leaflet. Mild regurgitation directed anteriorly.   ASSESSMENT AND PLAN:  1. Mitral valve endocarditis-streptococcal. IV antibiotics penicillin. Awaiting repeat blood cultures. No further fevers. Continue to monitor clinically. Only had mild regurgitation. Soft murmur heard. He notes that he was told that he had a murmur previously with mild regurgitation in the past. If symptoms change, i.e. increasing shortness of breath for instance, we will repeat echocardiogram sooner rather than later basis. I will likely repeat echocardiogram in the future. 2. Six-month followup  Signed, Candee Furbish, MD [facility]  06/22/2013 10:13 AM

## 2013-06-30 ENCOUNTER — Encounter: Payer: Self-pay | Admitting: Infectious Disease

## 2013-07-05 ENCOUNTER — Ambulatory Visit (INDEPENDENT_AMBULATORY_CARE_PROVIDER_SITE_OTHER): Payer: Medicare Other | Admitting: Infectious Disease

## 2013-07-05 ENCOUNTER — Encounter: Payer: Self-pay | Admitting: Infectious Disease

## 2013-07-05 VITALS — BP 127/81 | HR 64 | Temp 98.1°F | Wt 173.0 lb

## 2013-07-05 DIAGNOSIS — I38 Endocarditis, valve unspecified: Secondary | ICD-10-CM

## 2013-07-05 DIAGNOSIS — B954 Other streptococcus as the cause of diseases classified elsewhere: Secondary | ICD-10-CM

## 2013-07-05 DIAGNOSIS — A491 Streptococcal infection, unspecified site: Secondary | ICD-10-CM

## 2013-07-05 DIAGNOSIS — K639 Disease of intestine, unspecified: Secondary | ICD-10-CM

## 2013-07-05 NOTE — Progress Notes (Signed)
Subjective:    Patient ID: Jonathan Blackburn, male    DOB: 30-Aug-1945, 68 y.o.   MRN: [id number]  HPI   68 y.o. 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 He turned out to have a very sensitive variety a viridans group streptococcal endocarditis. He is nearly finished his 28 days of therapy from first negative blood culture on 05/23/2013 would last day of continuous penicillin being 06/19/2013 at which point in time the PICC line and removed be removed on the following day 06/20/2013.  He is been seen by his dentist who has performed a root canal after he was found to have an abscess.  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  He had follwup with  Dr. Azalee Course with cardiology and also to followup with Dr. Collene Mares with gastroenterology for consideration of endoscopic evaluation of his appendiceal abnormality.  He is now here for followup for checking of surveillance blood cultures but otherwise doing very well.   Review of Systems  Constitutional: Negative for fever, chills, diaphoresis, activity change, appetite change, fatigue and unexpected weight change.  HENT: Negative for congestion, rhinorrhea, sinus pressure, sneezing, sore throat and trouble swallowing.   Eyes: Negative for photophobia and visual disturbance.  Respiratory: Negative for cough, chest tightness, shortness of breath, wheezing and stridor.   Cardiovascular: Negative for chest pain, palpitations and leg swelling.  Gastrointestinal: Negative for nausea, vomiting, abdominal pain, diarrhea, constipation, blood in stool, abdominal distention and anal bleeding.  Genitourinary: Negative for dysuria, hematuria, flank pain and difficulty urinating.  Musculoskeletal: Negative for arthralgias, back pain, gait problem, joint swelling and myalgias.  Skin: Negative for color  change, pallor, rash and wound.  Neurological: Negative for dizziness, tremors, weakness and light-headedness.  Hematological: Negative for adenopathy. Does not bruise/bleed easily.  Psychiatric/Behavioral: Negative for behavioral problems, confusion, sleep disturbance, dysphoric mood, decreased concentration and agitation.       Objective:   Physical Exam  Constitutional: He is oriented to person, place, and time. He appears well-developed and well-nourished. No distress.  HENT:  Head: Normocephalic and atraumatic.  Mouth/Throat: Oropharynx is clear and moist. No oropharyngeal exudate.  Eyes: Conjunctivae and EOM are normal. No scleral icterus.  Neck: Normal range of motion. Neck supple. No JVD present.  Cardiovascular: Normal rate, regular rhythm and normal heart sounds.  Exam reveals no gallop and no friction rub.   No murmur heard. Pulmonary/Chest: Effort normal and breath sounds normal. No respiratory distress. He has no wheezes. He has no rales. He exhibits no tenderness.  Abdominal: He exhibits no distension and no mass. There is no tenderness. There is no rebound and no guarding.  Musculoskeletal: He exhibits no edema and no tenderness.  Lymphadenopathy:    He has no cervical adenopathy.  Neurological: He is alert and oriented to person, place, and time. He exhibits normal muscle tone. Coordination normal.  Skin: Skin is warm and dry. He is not diaphoretic. No erythema. No pallor.  Psychiatric: He has a normal mood and affect. His behavior is normal. Judgment and thought content normal.      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 Collene Mares.

## 2013-07-11 LAB — CULTURE, BLOOD (SINGLE)
ORGANISM ID, BACTERIA: NO GROWTH
Organism ID, Bacteria: NO GROWTH

## 2013-07-17 ENCOUNTER — Telehealth: Payer: Self-pay | Admitting: Infectious Diseases

## 2013-07-17 ENCOUNTER — Telehealth: Payer: Self-pay | Admitting: *Deleted

## 2013-07-17 NOTE — Telephone Encounter (Signed)
The American Heart Association DOES NOT recommend propyhlactic antiibiotics for routine GI procedures such as colonoscopy so he does NOT need abx

## 2013-07-17 NOTE — Telephone Encounter (Signed)
Called by pt's wife that he has a temp of 99.0.  I suggested that this is a normal temperature.  They will call Dr Tommy Medal in the AM.

## 2013-07-17 NOTE — Telephone Encounter (Addendum)
Patient wife called to advise that the patient is having a colonoscopy this week and wanted to know if the patient needed any pretreatment with antibiotics due to his recent endocarditis treatment. The patient is no longer on any antibiotics oral or IV. Advised her will ask the doctor and give her a call back once I get an answer. She advised to call her at [phone]

## 2013-07-17 NOTE — Telephone Encounter (Signed)
Called the patient and advised him no antibiotics needed.

## 2013-07-28 ENCOUNTER — Encounter: Payer: Self-pay | Admitting: Infectious Disease

## 2013-08-08 ENCOUNTER — Encounter: Payer: Self-pay | Admitting: Infectious Disease

## 2014-10-26 ENCOUNTER — Ambulatory Visit: Payer: Self-pay | Admitting: Cardiology

## 2014-11-26 ENCOUNTER — Ambulatory Visit (INDEPENDENT_AMBULATORY_CARE_PROVIDER_SITE_OTHER): Payer: Medicare Other | Admitting: Cardiology

## 2014-11-26 ENCOUNTER — Encounter: Payer: Self-pay | Admitting: Cardiology

## 2014-11-26 VITALS — BP 138/86 | HR 67 | Ht 68.0 in | Wt 190.6 lb

## 2014-11-26 DIAGNOSIS — I10 Essential (primary) hypertension: Secondary | ICD-10-CM

## 2014-11-26 DIAGNOSIS — I340 Nonrheumatic mitral (valve) insufficiency: Secondary | ICD-10-CM

## 2014-11-26 DIAGNOSIS — Z889 Allergy status to unspecified drugs, medicaments and biological substances status: Secondary | ICD-10-CM

## 2014-11-26 DIAGNOSIS — Z789 Other specified health status: Secondary | ICD-10-CM

## 2014-11-26 DIAGNOSIS — R011 Cardiac murmur, unspecified: Secondary | ICD-10-CM | POA: Diagnosis not present

## 2014-11-26 DIAGNOSIS — I059 Rheumatic mitral valve disease, unspecified: Secondary | ICD-10-CM

## 2014-11-26 DIAGNOSIS — I058 Other rheumatic mitral valve diseases: Secondary | ICD-10-CM | POA: Diagnosis not present

## 2014-11-26 DIAGNOSIS — E785 Hyperlipidemia, unspecified: Secondary | ICD-10-CM

## 2014-11-26 MED ORDER — SILDENAFIL CITRATE 100 MG PO TABS: 100.0000 mg | ORAL_TABLET | Freq: Every day | ORAL | Status: DC | PRN

## 2014-11-26 NOTE — Progress Notes (Signed)
[place]. [address], [address] [place], [place]  [address] Phone: [phone] Fax:  [phone]  Date:  11/26/2014   ID:  Jonathan Blackburn, DOB 09-Dec-1945, MRN [id number]  PCP:  Fae Pippin   History of Present Illness: Jonathan Blackburn is a 69 y.o. 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 Tommy Medal, in 2015 , mild mitral regurgitation here for followup.  Possible source of infection was left lower molar dental abscess but oral surgery did not feel that this was significant. However just had root canal. Gastroenterology workup pending as outpatient for thickening of appendix (Dr. Collene Mares). Echocardiogram showed mobile vegetation with mild mitral regurgitation on transesophageal echocardiogram 05/26/13.  Renal stone. Large. Stent.   Overall he is been doing well since his treatment. He takes dental antibiotics. No fevers, no chills, no excessive fatigue. In the past he was statin intolerant. He mentions that he has had difficulty with erectile dysfunction., No chest pain. No shortness of breath.   Wt Readings from Last 3 Encounters:  11/26/14 190 lb 9.6 oz (86.456 kg)  07/05/13 173 lb (78.472 kg)  06/22/13 170 lb (77.111 kg)     Past Medical History  Diagnosis Date  . Diabetes mellitus without complication ([place])   . Hypertension   . Heart murmur   . Arthritis   . Calculus of gallbladder without mention of cholecystitis or obstruction   . Thoracic or lumbosacral neuritis or radiculitis, unspecified   . Calculus of kidney   . Inguinal hernia without mention of obstruction or gangrene, bilateral, (not specified as recurrent)   . Other chronic nonalcoholic liver disease   . Closed fracture of multiple ribs, unspecified   . Closed fracture of dorsal (thoracic) vertebra without mention of spinal cord injury   . Benign neoplasm of colon   . Impotence of organic origin   . Pure hypercholesterolemia   . Mitral valve disorders   .  Diverticulosis of colon (without mention of hemorrhage)     Past Surgical History  Procedure Laterality Date  . Back surgery    . Shoulder arthroscopy      rotator cuff  . Posterior lumbar fusion 4 level N/A 11/16/2012    Procedure: Thoracic seven to thoracic eleven posterior lateral thoracic arthrodesis with instrumentation and bone graft ;  Surgeon: Ophelia Charter, MD;  Location: [facility];  Service: Neurosurgery;  Laterality: N/A;  Thoracic seven to thoracic eleven posterior lateral thoracic arthrodesis with instrumentation and bone graft  . Tee without cardioversion N/A 05/23/2013    Procedure: TRANSESOPHAGEAL ECHOCARDIOGRAM (TEE);  Surgeon: Candee Furbish, MD;  Location: [facility] ENDOSCOPY;  Service: Cardiovascular;  Laterality: N/A;  . Root canal      06/06/13  . Lithotripsy Right 06/12/13    Dr Diona Fanti  . Cystoscopy with retrograde pyelogram, ureteroscopy and stent placement Right 06/16/2013    Procedure: CYSTOSCOPY WITH RETROGRADE PYELOGRAM, URETEROSCOPY AND STENT PLACEMENT, AND STONE EXTRACTION RIGHT URETER;  Surgeon: Alexis Frock, MD;  Location: [facility];  Service: Urology;  Laterality: Right;  . Adenoidectomy    . Tonsillectomy      Current Outpatient Prescriptions  Medication Sig Dispense Refill  . amLODipine (NORVASC) 5 MG tablet Take 5 mg by mouth every morning.     Marland Kitchen aspirin EC 81 MG tablet Take 81 mg by mouth daily.    Marland Kitchen ezetimibe (ZETIA) 10 MG tablet Take 10 mg by mouth every morning.    Marland Kitchen glipiZIDE (GLUCOTROL XL)  5 MG 24 hr tablet Take 5 mg by mouth daily with breakfast.    . lisinopril (PRINIVIL,ZESTRIL) 20 MG tablet Take 20 mg by mouth every morning.      No current facility-administered medications for this visit.    Allergies:   No Known Allergies  Social History:  The patient  reports that he has never smoked. He does not have any smokeless tobacco history on file. He reports that he drinks alcohol. He reports that he does not use illicit drugs.   Family History    Problem Relation Age of Onset  . Hypertension Mother   . Diabetes Mellitus II Mother   . Cancer Mother   . Dementia Mother   . Coronary artery disease Father   . Cerebrovascular Accident Father     ROS:  Please see the history of present illness.   Denies any recurrent fevers, chest pain, syncope, stroke like symptoms, shortness of breath   All other systems reviewed and negative.   PHYSICAL EXAM: VS:  BP 138/86 mmHg  Pulse 67  Ht 5\' 8"  (1.727 m)  Wt 190 lb 9.6 oz (86.456 kg)  BMI 28.99 kg/m2 Well nourished, well developed, in no acute distress HEENT: normal, [place]/AT, EOMI Neck: no JVD, normal carotid upstroke, no bruit Cardiac:  normal S1, S2; RRR; 2/6 systolic murmurApex Lungs:  clear to auscultation bilaterally, no wheezing, rhonchi or rales Abd: soft, nontender, no hepatomegaly, no bruits Ext: no edema, 2+ distal pulses Skin: warm and dryNo endocarditis sequelae GU: deferred Neuro: no focal abnormalities noted, AAO x 3  EKG:  Today 11/26/14-sinus rhythm, 66, no other abnormalities, mild T-wave inversion noted in aVF as well as 3. Personally viewed. Prior Sinus rhythm, 82, rightward axis, nonspecific ST changes, artifact.     TEE: 05/23/13 -  - Mitral valve: There was a small, 35mm (L) x 3mm (W), strand-like, mobile vegetation on the atrial aspect of the body of the posterior leaflet. Mild regurgitation directed anteriorly.   ASSESSMENT AND PLAN:  1. Mitral valve endocarditis-2015 streptococcal. IV antibiotics penicillin previously. Repeat blood cultures were no growth on 07/05/13. No further fevers. Continue to monitor clinically. Only had mild mitral regurgitation. 2/6 murmur heard. We will recheck echo. He notes that he was told that he had a murmur previously with mild regurgitation in the past. He knows to take amoxicillin prior to dental appointments. 2. Essential hypertension-currently well controlled. Medication reviewed. 3. Erectile dysfunction-Viagra. He is not on  any nitrates. 4. Hyperlipidemia-tolerating Zetia well. He had problems with statins in the past. This is being monitored by Dr. Tobie Lords. 5. 99-month followup  Signed, Candee Furbish, MD [facility]  11/26/2014 9:27 AM

## 2014-11-26 NOTE — Patient Instructions (Signed)
Medication Instructions:  The current medical regimen is effective;  continue present plan and medications. You may use Viagra 100 mg as needed.  Testing/Procedures: Your physician has requested that you have an echocardiogram. Echocardiography is a painless test that uses sound waves to create images of your heart. It provides your doctor with information about the size and shape of your heart and how well your heart's chambers and valves are working. This procedure takes approximately one hour. There are no restrictions for this procedure.  Follow-Up: Follow up in 1 year with Dr. Marlou Porch.  You will receive a letter in the mail 2 months before you are due.  Please call us when you receive this letter to schedule your follow up appointment.  Thank you for choosing [facility]!!

## 2014-12-12 ENCOUNTER — Other Ambulatory Visit (HOSPITAL_COMMUNITY): Payer: Medicare Other

## 2014-12-18 ENCOUNTER — Ambulatory Visit (HOSPITAL_COMMUNITY): Payer: Medicare Other | Attending: Cardiology

## 2014-12-18 ENCOUNTER — Other Ambulatory Visit: Payer: Self-pay

## 2014-12-18 DIAGNOSIS — I340 Nonrheumatic mitral (valve) insufficiency: Secondary | ICD-10-CM | POA: Insufficient documentation

## 2014-12-18 DIAGNOSIS — I071 Rheumatic tricuspid insufficiency: Secondary | ICD-10-CM | POA: Insufficient documentation

## 2014-12-18 DIAGNOSIS — I10 Essential (primary) hypertension: Secondary | ICD-10-CM | POA: Diagnosis not present

## 2014-12-18 DIAGNOSIS — E785 Hyperlipidemia, unspecified: Secondary | ICD-10-CM | POA: Diagnosis not present

## 2014-12-18 DIAGNOSIS — R011 Cardiac murmur, unspecified: Secondary | ICD-10-CM | POA: Diagnosis not present

## 2014-12-18 DIAGNOSIS — I517 Cardiomegaly: Secondary | ICD-10-CM | POA: Diagnosis not present

## 2014-12-18 DIAGNOSIS — E119 Type 2 diabetes mellitus without complications: Secondary | ICD-10-CM | POA: Insufficient documentation

## 2015-12-09 ENCOUNTER — Encounter: Payer: Self-pay | Admitting: Cardiology

## 2015-12-09 ENCOUNTER — Ambulatory Visit (INDEPENDENT_AMBULATORY_CARE_PROVIDER_SITE_OTHER): Payer: Medicare Other | Admitting: Cardiology

## 2015-12-09 VITALS — BP 124/72 | HR 73 | Ht 68.0 in | Wt 192.0 lb

## 2015-12-09 DIAGNOSIS — I10 Essential (primary) hypertension: Secondary | ICD-10-CM | POA: Diagnosis not present

## 2015-12-09 DIAGNOSIS — Z8679 Personal history of other diseases of the circulatory system: Secondary | ICD-10-CM

## 2015-12-09 DIAGNOSIS — E7800 Pure hypercholesterolemia, unspecified: Secondary | ICD-10-CM | POA: Diagnosis not present

## 2015-12-09 NOTE — Progress Notes (Signed)
[place]. [address] [place], [place]  [address] Phone: [phone] Fax:  [phone]  Date:  12/09/2015   ID:  Jonathan Blackburn, DOB 1945-06-28, MRN [id number]  PCP:  Cyndi Bender, PA-C   History of Present Illness: Jonathan Blackburn is a 70 y.o. 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 Tommy Medal, in 2015 , mild mitral regurgitation here for followup.  Possible source of infection was left lower molar dental abscess but oral surgery did not feel that this was significant. However just had root canal. Gastroenterology workup pending as outpatient for thickening of appendix (Dr. Collene Mares). Echocardiogram showed mobile vegetation with mild mitral regurgitation on transesophageal echocardiogram 05/26/13.  Renal stone. Large. Stent.   Overall he is been doing well since his treatment. He takes dental antibiotics. No fevers, no chills, no excessive fatigue. In the past he was statin intolerant. He mentions that he has had difficulty with erectile dysfunction., No chest pain. No shortness of breath.   Wt Readings from Last 3 Encounters:  12/09/15 192 lb (87.1 kg)  11/26/14 190 lb 9.6 oz (86.5 kg)  07/05/13 173 lb (78.5 kg)     Past Medical History:  Diagnosis Date  . Arthritis   . Benign neoplasm of colon   . Calculus of gallbladder without mention of cholecystitis or obstruction   . Calculus of kidney   . Closed fracture of dorsal (thoracic) vertebra without mention of spinal cord injury   . Closed fracture of multiple ribs, unspecified   . Diabetes mellitus without complication ([place])   . Diverticulosis of colon (without mention of hemorrhage)   . Heart murmur   . Hypertension   . Impotence of organic origin   . Inguinal hernia without mention of obstruction or gangrene, bilateral, (not specified as recurrent)   . Mitral valve disorders(424.0)   . Other chronic nonalcoholic liver disease   . Pure hypercholesterolemia   . Thoracic or  lumbosacral neuritis or radiculitis, unspecified     Past Surgical History:  Procedure Laterality Date  . ADENOIDECTOMY    . BACK SURGERY    . CYSTOSCOPY WITH RETROGRADE PYELOGRAM, URETEROSCOPY AND STENT PLACEMENT Right 06/16/2013   Procedure: CYSTOSCOPY WITH RETROGRADE PYELOGRAM, URETEROSCOPY AND STENT PLACEMENT, AND STONE EXTRACTION RIGHT URETER;  Surgeon: Alexis Frock, MD;  Location: [facility];  Service: Urology;  Laterality: Right;  . LITHOTRIPSY Right 06/12/13   Dr Diona Fanti  . POSTERIOR LUMBAR FUSION 4 LEVEL N/A 11/16/2012   Procedure: Thoracic seven to thoracic eleven posterior lateral thoracic arthrodesis with instrumentation and bone graft ;  Surgeon: Ophelia Charter, MD;  Location: [facility];  Service: Neurosurgery;  Laterality: N/A;  Thoracic seven to thoracic eleven posterior lateral thoracic arthrodesis with instrumentation and bone graft  . ROOT CANAL     06/06/13  . SHOULDER ARTHROSCOPY     rotator cuff  . TEE WITHOUT CARDIOVERSION N/A 05/23/2013   Procedure: TRANSESOPHAGEAL ECHOCARDIOGRAM (TEE);  Surgeon: Candee Furbish, MD;  Location: [facility] ENDOSCOPY;  Service: Cardiovascular;  Laterality: N/A;  . TONSILLECTOMY      Current Outpatient Prescriptions  Medication Sig Dispense Refill  . amLODipine (NORVASC) 5 MG tablet Take 5 mg by mouth every morning.     Marland Kitchen aspirin EC 81 MG tablet Take 81 mg by mouth daily.    Marland Kitchen ezetimibe (ZETIA) 10 MG tablet Take 10 mg by mouth every morning.    Marland Kitchen glipiZIDE (GLUCOTROL XL) 5 MG 24 hr tablet  Take 5 mg by mouth daily with breakfast.    . lisinopril (PRINIVIL,ZESTRIL) 20 MG tablet Take 20 mg by mouth every morning.     . sildenafil (VIAGRA) 100 MG tablet Take 1 tablet (100 mg total) by mouth daily as needed for erectile dysfunction. 6 tablet 6   No current facility-administered medications for this visit.     Allergies:   No Known Allergies  Social History:  The patient  reports that he has never smoked. He does not have any smokeless tobacco  history on file. He reports that he drinks alcohol. He reports that he does not use drugs.   Family History  Problem Relation Age of Onset  . Hypertension Mother   . Diabetes Mellitus II Mother   . Cancer Mother   . Dementia Mother   . Coronary artery disease Father   . Cerebrovascular Accident Father     ROS:  Please see the history of present illness.   Denies any recurrent fevers, chest pain, syncope, stroke like symptoms, shortness of breath   All other systems reviewed and negative.   PHYSICAL EXAM: VS:  BP 124/72   Pulse 73   Ht 5\' 8"  (1.727 m)   Wt 192 lb (87.1 kg)   BMI 29.19 kg/m  Well nourished, well developed, in no acute distress HEENT: normal, [place]/AT, EOMI Neck: no JVD, normal carotid upstroke, no bruit Cardiac:  normal S1, S2; RRR; 2/6 systolic murmurApex Lungs:  clear to auscultation bilaterally, no wheezing, rhonchi or rales Abd: soft, nontender, no hepatomegaly, no bruits Ext: no edema, 2+ distal pulses Skin: warm and dryNo endocarditis sequelae GU: deferred Neuro: no focal abnormalities noted, AAO x 3  EKG:  Today 12/09/15-sinus rhythm 73 with no other abnormalities. 11/26/14-sinus rhythm, 66, no other abnormalities, mild T-wave inversion noted in aVF as well as 3. Personally viewed. Prior Sinus rhythm, 82, rightward axis, nonspecific ST changes, artifact.     TEE: 05/23/13 -  - Mitral valve: There was a small, 49mm (L) x 50mm (W), strand-like, mobile vegetation on the atrial aspect of the body of the posterior leaflet. Mild regurgitation directed anteriorly.  ECHO: 12/18/14 Reassuring echo. Normal EF. Trace MR/TR  ASSESSMENT AND PLAN:  1. Mitral valve endocarditis-2015 streptococcal. IV antibiotics penicillin previously. Repeat blood cultures were no growth on 07/05/13. No further fevers.  Trivial mitral regurgitation on repeat echocardiogram. He knows to take amoxicillin prior to dental appointments. 2. Essential hypertension-currently well controlled.  Medication reviewed. 3. Erectile dysfunction-Viagra. He is not on any nitrates. 4. Hyperlipidemia-tolerating Zetia well. He had problems with statins in the past. This is being monitored by Dr. Tobie Lords. Overall doing quite well. 5. 2 year followup  Signed, Candee Furbish, MD [facility]  12/09/2015 2:21 PM

## 2015-12-09 NOTE — Patient Instructions (Signed)
Medication Instructions:  Your physician recommends that you continue on your current medications as directed. Please refer to the Current Medication list given to you today.   Labwork: None  Testing/Procedures: None   Follow-Up: Your physician wants you to follow-up in: 2 years with Dr Marlou Porch. (October 2019).  You will receive a reminder letter in the mail two months in advance. If you don't receive a letter, please call our office to schedule the follow-up appointment.        If you need a refill on your cardiac medications before your next appointment, please call your pharmacy.

## 2017-12-08 ENCOUNTER — Ambulatory Visit: Payer: Medicare Other | Admitting: Cardiology

## 2017-12-08 ENCOUNTER — Encounter: Payer: Self-pay | Admitting: Cardiology

## 2017-12-08 ENCOUNTER — Encounter (INDEPENDENT_AMBULATORY_CARE_PROVIDER_SITE_OTHER): Payer: Self-pay

## 2017-12-08 VITALS — BP 134/72 | HR 64 | Ht 68.0 in | Wt 190.6 lb

## 2017-12-08 DIAGNOSIS — E7800 Pure hypercholesterolemia, unspecified: Secondary | ICD-10-CM

## 2017-12-08 DIAGNOSIS — I058 Other rheumatic mitral valve diseases: Secondary | ICD-10-CM | POA: Diagnosis not present

## 2017-12-08 DIAGNOSIS — I10 Essential (primary) hypertension: Secondary | ICD-10-CM

## 2017-12-08 DIAGNOSIS — I059 Rheumatic mitral valve disease, unspecified: Secondary | ICD-10-CM

## 2017-12-08 NOTE — Patient Instructions (Addendum)
Your physician recommends that you continue on your current medications as directed. Please refer to the Current Medication list given to you today.   Your physician wants you to follow-up in:  [place] will receive a reminder letter in the mail two months in advance. If you don't receive a letter, please call our office to schedule the follow-up appointment.

## 2017-12-08 NOTE — Progress Notes (Signed)
Cardiology Office Note:    Date:  12/08/2017   ID:  Jonathan Blackburn, DOB January 26, 1946, MRN [id number]  PCP:  Cyndi Bender, PA-C  Cardiologist:  No primary care provider on file.  Electrophysiologist:  None   Referring MD: Cyndi Bender, PA-C     History of Present Illness:    Jonathan Blackburn is a 72 y.o. male for the follow-up of previous endocarditis, mild mitral regurgitation.  On May 26, 2013 echocardiogram showed mobile vegetation of mitral valve.  Strep viridans.  He had a left lower molar dental abscess.  He does have some trouble at times maintaining his sleep.  He can fall asleep early but wakes up occasionally during the middle of night.  Also had some twinges of chest discomfort sharp left upper chest pain fleeting very rare.  Nonexertional.  Also has some right leg cramping.  Thinks it is from his prior back surgery.  Normal distal pulses noted.  Doing well.  No fevers chills nausea vomiting syncope bleeding.  Past Medical History:  Diagnosis Date  . Arthritis   . Benign neoplasm of colon   . Calculus of gallbladder without mention of cholecystitis or obstruction   . Calculus of kidney   . Closed fracture of dorsal (thoracic) vertebra without mention of spinal cord injury   . Closed fracture of multiple ribs, unspecified   . Diabetes mellitus without complication ([place])   . Diverticulosis of colon (without mention of hemorrhage)   . Heart murmur   . Hypertension   . Impotence of organic origin   . Inguinal hernia without mention of obstruction or gangrene, bilateral, (not specified as recurrent)   . Mitral valve disorders(424.0)   . Other chronic nonalcoholic liver disease   . Pure hypercholesterolemia   . Thoracic or lumbosacral neuritis or radiculitis, unspecified     Past Surgical History:  Procedure Laterality Date  . ADENOIDECTOMY    . BACK SURGERY    . CYSTOSCOPY WITH RETROGRADE PYELOGRAM, URETEROSCOPY AND STENT PLACEMENT Right 06/16/2013   Procedure: CYSTOSCOPY WITH RETROGRADE PYELOGRAM, URETEROSCOPY AND STENT PLACEMENT, AND STONE EXTRACTION RIGHT URETER;  Surgeon: Alexis Frock, MD;  Location: [facility];  Service: Urology;  Laterality: Right;  . LITHOTRIPSY Right 06/12/13   Dr Diona Fanti  . POSTERIOR LUMBAR FUSION 4 LEVEL N/A 11/16/2012   Procedure: Thoracic seven to thoracic eleven posterior lateral thoracic arthrodesis with instrumentation and bone graft ;  Surgeon: Ophelia Charter, MD;  Location: [facility];  Service: Neurosurgery;  Laterality: N/A;  Thoracic seven to thoracic eleven posterior lateral thoracic arthrodesis with instrumentation and bone graft  . ROOT CANAL     06/06/13  . SHOULDER ARTHROSCOPY     rotator cuff  . TEE WITHOUT CARDIOVERSION N/A 05/23/2013   Procedure: TRANSESOPHAGEAL ECHOCARDIOGRAM (TEE);  Surgeon: Candee Furbish, MD;  Location[facility] ENDOSCOPY;  Service: Cardiovascular;  Laterality: N/A;  . TONSILLECTOMY      Current Medications: Current Meds  Medication Sig  . amLODipine (NORVASC) 5 MG tablet Take 5 mg by mouth every morning.   Marland Kitchen aspirin EC 81 MG tablet Take 81 mg by mouth daily.  Marland Kitchen ezetimibe (ZETIA) 10 MG tablet Take 10 mg by mouth every morning.  Marland Kitchen glipiZIDE (GLUCOTROL XL) 5 MG 24 hr tablet Take 5 mg by mouth daily with breakfast.  . lisinopril (PRINIVIL,ZESTRIL) 20 MG tablet Take 20 mg by mouth every morning.      Allergies:   Patient has no known allergies.   Social History  Socioeconomic History  . Marital status: Single    Spouse name: Not on file  . Number of children: Not on file  . Years of education: Not on file  . Highest education level: Not on file  Occupational History  . Not on file  Social Needs  . Financial resource strain: Not on file  . Food insecurity:    Worry: Not on file    Inability: Not on file  . Transportation needs:    Medical: Not on file    Non-medical: Not on file  Tobacco Use  . Smoking status: Never Smoker  . Smokeless tobacco: Never Used    Substance and Sexual Activity  . Alcohol use: Yes    Comment: rare  . Drug use: No  . Sexual activity: Not on file  Lifestyle  . Physical activity:    Days per week: Not on file    Minutes per session: Not on file  . Stress: Not on file  Relationships  . Social connections:    Talks on phone: Not on file    Gets together: Not on file    Attends religious service: Not on file    Active member of club or organization: Not on file    Attends meetings of clubs or organizations: Not on file    Relationship status: Not on file  Other Topics Concern  . Not on file  Social History Narrative  . Not on file     Family History: The patient's family history includes Cancer in his mother; Cerebrovascular Accident in his father; Coronary artery disease in his father; Dementia in his mother; Diabetes Mellitus II in his mother; Hypertension in his mother.  ROS:   Please see the history of present illness.    Occasional leg cramping, right side back pain muscle pain all other systems reviewed and are negative.  EKGs/Labs/Other Studies Reviewed:    The following studies were reviewed today: Prior office notes, echocardiogram, EKG  TEE: 05/23/13 -  - Mitral valve: There was a small, 35mm (L) x 67mm (W), strand-like, mobile vegetation on the atrial aspect of the body of the posterior leaflet. Mild regurgitation directed anteriorly.  ECHO: 12/18/14 Reassuring echo. Normal EF. Trace MR/TR  EKG:  EKG is  ordered today.  The ekg ordered today demonstrates normal sinus rhythm 64 with no other significant abnormalities personally reviewed, no change from prior.   Recent Labs: No results found for requested labs within last 8760 hours.  Recent Lipid Panel No results found for: CHOL, TRIG, HDL, CHOLHDL, VLDL, LDLCALC, LDLDIRECT  Physical Exam:    VS:  BP 134/72   Pulse 64   Ht 5\' 8"  (1.727 m)   Wt 190 lb 9.6 oz (86.5 kg)   SpO2 96%   BMI 28.98 kg/m     Wt Readings from Last 3  Encounters:  12/08/17 190 lb 9.6 oz (86.5 kg)  12/09/15 192 lb (87.1 kg)  11/26/14 190 lb 9.6 oz (86.5 kg)     GEN:  Well nourished, well developed in no acute distress HEENT: Normal NECK: No JVD; No carotid bruits LYMPHATICS: No lymphadenopathy CARDIAC: RRR, soft systolic murmur, no rubs, gallops RESPIRATORY:  Clear to auscultation without rales, wheezing or rhonchi  ABDOMEN: Soft, non-tender, non-distended MUSCULOSKELETAL:  No edema; No deformity  SKIN: Warm and dry NEUROLOGIC:  Alert and oriented x 3 PSYCHIATRIC:  Normal affect   ASSESSMENT:    1. Endocarditis of mitral valve   2. Essential hypertension, benign  3. Pure hypercholesterolemia    PLAN:    In order of problems listed above:  History of mitral valve endocarditis strep viridans in 2015 - Dental antibiotics.  Trivial mitral regurgitation previously.  Essential hypertension - Currently well controlled.  No changes made.  Hyperlipidemia - Takes Zetia.  Has had trouble with statin intolerant from a prior LDL 46 triglycerides 140 HDL 37.  We will see her back in 2 years.   Medication Adjustments/Labs and Tests Ordered: Current medicines are reviewed at length with the patient today.  Concerns regarding medicines are outlined above.  Orders Placed This Encounter  Procedures  . EKG 12-Lead   No orders of the defined types were placed in this encounter.   Patient Instructions  Your physician recommends that you continue on your current medications as directed. Please refer to the Current Medication list given to you today.   Your physician wants you to follow-up in:  [place] will receive a reminder letter in the mail two months in advance. If you don't receive a letter, please call our office to schedule the follow-up appointment.     Signed, Candee Furbish, MD  12/08/2017 12:07 PM    [place]

## 2019-06-13 DIAGNOSIS — E119 Type 2 diabetes mellitus without complications: Secondary | ICD-10-CM | POA: Diagnosis not present

## 2019-06-13 DIAGNOSIS — Z1389 Encounter for screening for other disorder: Secondary | ICD-10-CM | POA: Diagnosis not present

## 2019-06-13 DIAGNOSIS — I10 Essential (primary) hypertension: Secondary | ICD-10-CM | POA: Diagnosis not present

## 2019-06-13 DIAGNOSIS — Z6830 Body mass index (BMI) 30.0-30.9, adult: Secondary | ICD-10-CM | POA: Diagnosis not present

## 2019-06-13 DIAGNOSIS — E785 Hyperlipidemia, unspecified: Secondary | ICD-10-CM | POA: Diagnosis not present

## 2019-06-13 DIAGNOSIS — I38 Endocarditis, valve unspecified: Secondary | ICD-10-CM | POA: Diagnosis not present

## 2019-06-15 DIAGNOSIS — M7062 Trochanteric bursitis, left hip: Secondary | ICD-10-CM | POA: Diagnosis not present

## 2019-06-15 DIAGNOSIS — M25552 Pain in left hip: Secondary | ICD-10-CM | POA: Diagnosis not present

## 2019-06-29 DIAGNOSIS — I38 Endocarditis, valve unspecified: Secondary | ICD-10-CM | POA: Diagnosis not present

## 2019-07-25 DIAGNOSIS — M25552 Pain in left hip: Secondary | ICD-10-CM | POA: Diagnosis not present

## 2019-07-25 DIAGNOSIS — M545 Low back pain: Secondary | ICD-10-CM | POA: Diagnosis not present

## 2019-07-25 DIAGNOSIS — M7062 Trochanteric bursitis, left hip: Secondary | ICD-10-CM | POA: Diagnosis not present

## 2019-08-10 DIAGNOSIS — Z6830 Body mass index (BMI) 30.0-30.9, adult: Secondary | ICD-10-CM | POA: Diagnosis not present

## 2019-08-10 DIAGNOSIS — E669 Obesity, unspecified: Secondary | ICD-10-CM | POA: Diagnosis not present

## 2019-08-10 DIAGNOSIS — R319 Hematuria, unspecified: Secondary | ICD-10-CM | POA: Diagnosis not present

## 2019-08-10 DIAGNOSIS — M5416 Radiculopathy, lumbar region: Secondary | ICD-10-CM | POA: Diagnosis not present

## 2019-08-25 DIAGNOSIS — D1724 Benign lipomatous neoplasm of skin and subcutaneous tissue of left leg: Secondary | ICD-10-CM | POA: Diagnosis not present

## 2019-08-25 DIAGNOSIS — D225 Melanocytic nevi of trunk: Secondary | ICD-10-CM | POA: Diagnosis not present

## 2019-08-25 DIAGNOSIS — D485 Neoplasm of uncertain behavior of skin: Secondary | ICD-10-CM | POA: Diagnosis not present

## 2019-08-25 DIAGNOSIS — D2261 Melanocytic nevi of right upper limb, including shoulder: Secondary | ICD-10-CM | POA: Diagnosis not present

## 2019-08-25 DIAGNOSIS — L821 Other seborrheic keratosis: Secondary | ICD-10-CM | POA: Diagnosis not present

## 2019-08-25 DIAGNOSIS — D0371 Melanoma in situ of right lower limb, including hip: Secondary | ICD-10-CM | POA: Diagnosis not present

## 2019-08-25 DIAGNOSIS — D2262 Melanocytic nevi of left upper limb, including shoulder: Secondary | ICD-10-CM | POA: Diagnosis not present

## 2019-08-25 DIAGNOSIS — Z85828 Personal history of other malignant neoplasm of skin: Secondary | ICD-10-CM | POA: Diagnosis not present

## 2019-08-25 DIAGNOSIS — D2271 Melanocytic nevi of right lower limb, including hip: Secondary | ICD-10-CM | POA: Diagnosis not present

## 2019-09-01 DIAGNOSIS — M48061 Spinal stenosis, lumbar region without neurogenic claudication: Secondary | ICD-10-CM | POA: Diagnosis not present

## 2019-09-01 DIAGNOSIS — M5416 Radiculopathy, lumbar region: Secondary | ICD-10-CM | POA: Diagnosis not present

## 2019-09-01 DIAGNOSIS — M545 Low back pain: Secondary | ICD-10-CM | POA: Diagnosis not present

## 2019-09-01 DIAGNOSIS — M47816 Spondylosis without myelopathy or radiculopathy, lumbar region: Secondary | ICD-10-CM | POA: Diagnosis not present

## 2019-09-07 DIAGNOSIS — D0371 Melanoma in situ of right lower limb, including hip: Secondary | ICD-10-CM | POA: Diagnosis not present

## 2019-09-14 DIAGNOSIS — M545 Low back pain: Secondary | ICD-10-CM | POA: Diagnosis not present

## 2019-09-14 DIAGNOSIS — I10 Essential (primary) hypertension: Secondary | ICD-10-CM | POA: Diagnosis not present

## 2019-09-14 DIAGNOSIS — M6281 Muscle weakness (generalized): Secondary | ICD-10-CM | POA: Diagnosis not present

## 2019-09-14 DIAGNOSIS — R293 Abnormal posture: Secondary | ICD-10-CM | POA: Diagnosis not present

## 2019-09-14 DIAGNOSIS — E785 Hyperlipidemia, unspecified: Secondary | ICD-10-CM | POA: Diagnosis not present

## 2019-09-19 DIAGNOSIS — M545 Low back pain: Secondary | ICD-10-CM | POA: Diagnosis not present

## 2019-09-19 DIAGNOSIS — M6281 Muscle weakness (generalized): Secondary | ICD-10-CM | POA: Diagnosis not present

## 2019-09-19 DIAGNOSIS — R293 Abnormal posture: Secondary | ICD-10-CM | POA: Diagnosis not present

## 2019-09-21 DIAGNOSIS — H903 Sensorineural hearing loss, bilateral: Secondary | ICD-10-CM | POA: Diagnosis not present

## 2019-09-21 DIAGNOSIS — Z6831 Body mass index (BMI) 31.0-31.9, adult: Secondary | ICD-10-CM | POA: Diagnosis not present

## 2019-09-21 DIAGNOSIS — H906 Mixed conductive and sensorineural hearing loss, bilateral: Secondary | ICD-10-CM | POA: Diagnosis not present

## 2019-09-21 DIAGNOSIS — E119 Type 2 diabetes mellitus without complications: Secondary | ICD-10-CM | POA: Diagnosis not present

## 2019-09-21 DIAGNOSIS — H6123 Impacted cerumen, bilateral: Secondary | ICD-10-CM | POA: Diagnosis not present

## 2019-09-21 DIAGNOSIS — I340 Nonrheumatic mitral (valve) insufficiency: Secondary | ICD-10-CM | POA: Diagnosis not present

## 2019-09-21 DIAGNOSIS — R319 Hematuria, unspecified: Secondary | ICD-10-CM | POA: Diagnosis not present

## 2019-09-21 DIAGNOSIS — M545 Low back pain: Secondary | ICD-10-CM | POA: Diagnosis not present

## 2019-09-21 DIAGNOSIS — I10 Essential (primary) hypertension: Secondary | ICD-10-CM | POA: Diagnosis not present

## 2019-09-21 DIAGNOSIS — E785 Hyperlipidemia, unspecified: Secondary | ICD-10-CM | POA: Diagnosis not present

## 2019-09-22 DIAGNOSIS — M6281 Muscle weakness (generalized): Secondary | ICD-10-CM | POA: Diagnosis not present

## 2019-09-22 DIAGNOSIS — R293 Abnormal posture: Secondary | ICD-10-CM | POA: Diagnosis not present

## 2019-09-22 DIAGNOSIS — M545 Low back pain: Secondary | ICD-10-CM | POA: Diagnosis not present

## 2019-09-27 DIAGNOSIS — R293 Abnormal posture: Secondary | ICD-10-CM | POA: Diagnosis not present

## 2019-09-27 DIAGNOSIS — M545 Low back pain: Secondary | ICD-10-CM | POA: Diagnosis not present

## 2019-09-27 DIAGNOSIS — M6281 Muscle weakness (generalized): Secondary | ICD-10-CM | POA: Diagnosis not present

## 2019-10-02 DIAGNOSIS — M545 Low back pain: Secondary | ICD-10-CM | POA: Diagnosis not present

## 2019-10-02 DIAGNOSIS — R293 Abnormal posture: Secondary | ICD-10-CM | POA: Diagnosis not present

## 2019-10-02 DIAGNOSIS — M6281 Muscle weakness (generalized): Secondary | ICD-10-CM | POA: Diagnosis not present

## 2019-10-04 DIAGNOSIS — R293 Abnormal posture: Secondary | ICD-10-CM | POA: Diagnosis not present

## 2019-10-04 DIAGNOSIS — M545 Low back pain: Secondary | ICD-10-CM | POA: Diagnosis not present

## 2019-10-04 DIAGNOSIS — M6281 Muscle weakness (generalized): Secondary | ICD-10-CM | POA: Diagnosis not present

## 2019-10-09 DIAGNOSIS — M6281 Muscle weakness (generalized): Secondary | ICD-10-CM | POA: Diagnosis not present

## 2019-10-09 DIAGNOSIS — M545 Low back pain: Secondary | ICD-10-CM | POA: Diagnosis not present

## 2019-10-09 DIAGNOSIS — R293 Abnormal posture: Secondary | ICD-10-CM | POA: Diagnosis not present

## 2019-10-11 DIAGNOSIS — M6281 Muscle weakness (generalized): Secondary | ICD-10-CM | POA: Diagnosis not present

## 2019-10-11 DIAGNOSIS — M545 Low back pain: Secondary | ICD-10-CM | POA: Diagnosis not present

## 2019-10-11 DIAGNOSIS — R293 Abnormal posture: Secondary | ICD-10-CM | POA: Diagnosis not present

## 2019-10-18 DIAGNOSIS — M545 Low back pain: Secondary | ICD-10-CM | POA: Diagnosis not present

## 2019-10-18 DIAGNOSIS — R293 Abnormal posture: Secondary | ICD-10-CM | POA: Diagnosis not present

## 2019-10-18 DIAGNOSIS — M6281 Muscle weakness (generalized): Secondary | ICD-10-CM | POA: Diagnosis not present

## 2019-10-24 DIAGNOSIS — R310 Gross hematuria: Secondary | ICD-10-CM | POA: Diagnosis not present

## 2019-10-25 DIAGNOSIS — M6281 Muscle weakness (generalized): Secondary | ICD-10-CM | POA: Diagnosis not present

## 2019-10-25 DIAGNOSIS — M545 Low back pain: Secondary | ICD-10-CM | POA: Diagnosis not present

## 2019-10-25 DIAGNOSIS — R293 Abnormal posture: Secondary | ICD-10-CM | POA: Diagnosis not present

## 2019-11-01 DIAGNOSIS — K8020 Calculus of gallbladder without cholecystitis without obstruction: Secondary | ICD-10-CM | POA: Diagnosis not present

## 2019-11-01 DIAGNOSIS — N200 Calculus of kidney: Secondary | ICD-10-CM | POA: Diagnosis not present

## 2019-11-01 DIAGNOSIS — R310 Gross hematuria: Secondary | ICD-10-CM | POA: Diagnosis not present

## 2019-11-06 DIAGNOSIS — Z9181 History of falling: Secondary | ICD-10-CM | POA: Diagnosis not present

## 2019-11-06 DIAGNOSIS — E785 Hyperlipidemia, unspecified: Secondary | ICD-10-CM | POA: Diagnosis not present

## 2019-11-06 DIAGNOSIS — E669 Obesity, unspecified: Secondary | ICD-10-CM | POA: Diagnosis not present

## 2019-11-06 DIAGNOSIS — Z0000 Encounter for general adult medical examination without abnormal findings: Secondary | ICD-10-CM | POA: Diagnosis not present

## 2019-11-06 DIAGNOSIS — Z1331 Encounter for screening for depression: Secondary | ICD-10-CM | POA: Diagnosis not present

## 2019-11-16 DIAGNOSIS — Z85820 Personal history of malignant melanoma of skin: Secondary | ICD-10-CM | POA: Diagnosis not present

## 2019-11-16 DIAGNOSIS — X32XXXA Exposure to sunlight, initial encounter: Secondary | ICD-10-CM | POA: Diagnosis not present

## 2019-11-16 DIAGNOSIS — L821 Other seborrheic keratosis: Secondary | ICD-10-CM | POA: Diagnosis not present

## 2019-11-16 DIAGNOSIS — D225 Melanocytic nevi of trunk: Secondary | ICD-10-CM | POA: Diagnosis not present

## 2019-11-16 DIAGNOSIS — L570 Actinic keratosis: Secondary | ICD-10-CM | POA: Diagnosis not present

## 2019-11-16 DIAGNOSIS — Z85828 Personal history of other malignant neoplasm of skin: Secondary | ICD-10-CM | POA: Diagnosis not present

## 2019-11-16 DIAGNOSIS — Z872 Personal history of diseases of the skin and subcutaneous tissue: Secondary | ICD-10-CM | POA: Diagnosis not present

## 2019-11-20 DIAGNOSIS — N323 Diverticulum of bladder: Secondary | ICD-10-CM | POA: Diagnosis not present

## 2019-11-20 DIAGNOSIS — N35911 Unspecified urethral stricture, male, meatal: Secondary | ICD-10-CM | POA: Diagnosis not present

## 2019-11-20 DIAGNOSIS — N401 Enlarged prostate with lower urinary tract symptoms: Secondary | ICD-10-CM | POA: Diagnosis not present

## 2019-11-20 DIAGNOSIS — N4289 Other specified disorders of prostate: Secondary | ICD-10-CM | POA: Diagnosis not present

## 2019-11-20 DIAGNOSIS — N3289 Other specified disorders of bladder: Secondary | ICD-10-CM | POA: Diagnosis not present

## 2019-11-20 DIAGNOSIS — R310 Gross hematuria: Secondary | ICD-10-CM | POA: Diagnosis not present

## 2019-12-14 DIAGNOSIS — I340 Nonrheumatic mitral (valve) insufficiency: Secondary | ICD-10-CM | POA: Diagnosis not present

## 2019-12-14 DIAGNOSIS — E669 Obesity, unspecified: Secondary | ICD-10-CM | POA: Diagnosis not present

## 2019-12-14 DIAGNOSIS — E785 Hyperlipidemia, unspecified: Secondary | ICD-10-CM | POA: Diagnosis not present

## 2019-12-14 DIAGNOSIS — E119 Type 2 diabetes mellitus without complications: Secondary | ICD-10-CM | POA: Diagnosis not present

## 2019-12-14 DIAGNOSIS — Z6830 Body mass index (BMI) 30.0-30.9, adult: Secondary | ICD-10-CM | POA: Diagnosis not present

## 2019-12-14 DIAGNOSIS — I38 Endocarditis, valve unspecified: Secondary | ICD-10-CM | POA: Diagnosis not present

## 2019-12-14 DIAGNOSIS — I10 Essential (primary) hypertension: Secondary | ICD-10-CM | POA: Diagnosis not present

## 2020-01-02 DIAGNOSIS — I38 Endocarditis, valve unspecified: Secondary | ICD-10-CM | POA: Diagnosis not present

## 2020-03-08 DIAGNOSIS — R310 Gross hematuria: Secondary | ICD-10-CM | POA: Diagnosis not present

## 2020-03-08 DIAGNOSIS — N390 Urinary tract infection, site not specified: Secondary | ICD-10-CM | POA: Diagnosis not present

## 2020-03-08 DIAGNOSIS — N401 Enlarged prostate with lower urinary tract symptoms: Secondary | ICD-10-CM | POA: Diagnosis not present

## 2020-03-08 DIAGNOSIS — N200 Calculus of kidney: Secondary | ICD-10-CM | POA: Diagnosis not present

## 2020-03-26 DIAGNOSIS — I330 Acute and subacute infective endocarditis: Secondary | ICD-10-CM | POA: Diagnosis not present

## 2020-03-26 DIAGNOSIS — M545 Low back pain, unspecified: Secondary | ICD-10-CM | POA: Diagnosis not present

## 2020-03-26 DIAGNOSIS — T466X5A Adverse effect of antihyperlipidemic and antiarteriosclerotic drugs, initial encounter: Secondary | ICD-10-CM | POA: Diagnosis not present

## 2020-03-26 DIAGNOSIS — I340 Nonrheumatic mitral (valve) insufficiency: Secondary | ICD-10-CM | POA: Diagnosis not present

## 2020-03-26 DIAGNOSIS — N400 Enlarged prostate without lower urinary tract symptoms: Secondary | ICD-10-CM | POA: Diagnosis not present

## 2020-03-26 DIAGNOSIS — M791 Myalgia, unspecified site: Secondary | ICD-10-CM | POA: Diagnosis not present

## 2020-03-26 DIAGNOSIS — E119 Type 2 diabetes mellitus without complications: Secondary | ICD-10-CM | POA: Diagnosis not present

## 2020-03-26 DIAGNOSIS — I10 Essential (primary) hypertension: Secondary | ICD-10-CM | POA: Diagnosis not present

## 2020-03-26 DIAGNOSIS — E7800 Pure hypercholesterolemia, unspecified: Secondary | ICD-10-CM | POA: Diagnosis not present

## 2020-05-07 DIAGNOSIS — N401 Enlarged prostate with lower urinary tract symptoms: Secondary | ICD-10-CM | POA: Diagnosis not present

## 2020-05-09 DIAGNOSIS — M1711 Unilateral primary osteoarthritis, right knee: Secondary | ICD-10-CM | POA: Diagnosis not present

## 2020-06-06 ENCOUNTER — Ambulatory Visit: Payer: Medicare PPO | Admitting: Nutrition

## 2020-06-20 DIAGNOSIS — D2261 Melanocytic nevi of right upper limb, including shoulder: Secondary | ICD-10-CM | POA: Diagnosis not present

## 2020-06-20 DIAGNOSIS — D225 Melanocytic nevi of trunk: Secondary | ICD-10-CM | POA: Diagnosis not present

## 2020-06-20 DIAGNOSIS — D2262 Melanocytic nevi of left upper limb, including shoulder: Secondary | ICD-10-CM | POA: Diagnosis not present

## 2020-06-20 DIAGNOSIS — Z85820 Personal history of malignant melanoma of skin: Secondary | ICD-10-CM | POA: Diagnosis not present

## 2020-06-20 DIAGNOSIS — Z85828 Personal history of other malignant neoplasm of skin: Secondary | ICD-10-CM | POA: Diagnosis not present

## 2020-06-20 DIAGNOSIS — Z872 Personal history of diseases of the skin and subcutaneous tissue: Secondary | ICD-10-CM | POA: Diagnosis not present

## 2020-06-20 DIAGNOSIS — Z09 Encounter for follow-up examination after completed treatment for conditions other than malignant neoplasm: Secondary | ICD-10-CM | POA: Diagnosis not present

## 2020-06-20 DIAGNOSIS — D2271 Melanocytic nevi of right lower limb, including hip: Secondary | ICD-10-CM | POA: Diagnosis not present

## 2020-06-20 DIAGNOSIS — Z08 Encounter for follow-up examination after completed treatment for malignant neoplasm: Secondary | ICD-10-CM | POA: Diagnosis not present

## 2020-07-29 DIAGNOSIS — I38 Endocarditis, valve unspecified: Secondary | ICD-10-CM | POA: Diagnosis not present

## 2020-07-29 DIAGNOSIS — I10 Essential (primary) hypertension: Secondary | ICD-10-CM | POA: Diagnosis not present

## 2020-07-29 DIAGNOSIS — R079 Chest pain, unspecified: Secondary | ICD-10-CM | POA: Diagnosis not present

## 2020-07-29 DIAGNOSIS — E119 Type 2 diabetes mellitus without complications: Secondary | ICD-10-CM | POA: Diagnosis not present

## 2020-07-29 DIAGNOSIS — E785 Hyperlipidemia, unspecified: Secondary | ICD-10-CM | POA: Diagnosis not present

## 2020-07-29 DIAGNOSIS — I340 Nonrheumatic mitral (valve) insufficiency: Secondary | ICD-10-CM | POA: Diagnosis not present

## 2020-08-13 DIAGNOSIS — R079 Chest pain, unspecified: Secondary | ICD-10-CM | POA: Diagnosis not present

## 2020-09-16 DIAGNOSIS — E119 Type 2 diabetes mellitus without complications: Secondary | ICD-10-CM | POA: Diagnosis not present

## 2020-09-16 DIAGNOSIS — R319 Hematuria, unspecified: Secondary | ICD-10-CM | POA: Diagnosis not present

## 2020-09-16 DIAGNOSIS — E785 Hyperlipidemia, unspecified: Secondary | ICD-10-CM | POA: Diagnosis not present

## 2020-09-23 DIAGNOSIS — D45 Polycythemia vera: Secondary | ICD-10-CM | POA: Diagnosis not present

## 2020-09-23 DIAGNOSIS — Z7984 Long term (current) use of oral hypoglycemic drugs: Secondary | ICD-10-CM | POA: Diagnosis not present

## 2020-09-23 DIAGNOSIS — E119 Type 2 diabetes mellitus without complications: Secondary | ICD-10-CM | POA: Diagnosis not present

## 2020-09-23 DIAGNOSIS — E785 Hyperlipidemia, unspecified: Secondary | ICD-10-CM | POA: Diagnosis not present

## 2020-09-25 DIAGNOSIS — J449 Chronic obstructive pulmonary disease, unspecified: Secondary | ICD-10-CM | POA: Diagnosis not present

## 2020-10-15 DIAGNOSIS — G4733 Obstructive sleep apnea (adult) (pediatric): Secondary | ICD-10-CM | POA: Diagnosis not present

## 2020-11-07 DIAGNOSIS — Z1331 Encounter for screening for depression: Secondary | ICD-10-CM | POA: Diagnosis not present

## 2020-11-07 DIAGNOSIS — Z6831 Body mass index (BMI) 31.0-31.9, adult: Secondary | ICD-10-CM | POA: Diagnosis not present

## 2020-11-07 DIAGNOSIS — Z9181 History of falling: Secondary | ICD-10-CM | POA: Diagnosis not present

## 2020-11-07 DIAGNOSIS — Z0000 Encounter for general adult medical examination without abnormal findings: Secondary | ICD-10-CM | POA: Diagnosis not present

## 2020-11-07 DIAGNOSIS — Z139 Encounter for screening, unspecified: Secondary | ICD-10-CM | POA: Diagnosis not present

## 2020-11-07 DIAGNOSIS — E785 Hyperlipidemia, unspecified: Secondary | ICD-10-CM | POA: Diagnosis not present

## 2020-11-07 DIAGNOSIS — E669 Obesity, unspecified: Secondary | ICD-10-CM | POA: Diagnosis not present

## 2020-11-11 DIAGNOSIS — E109 Type 1 diabetes mellitus without complications: Secondary | ICD-10-CM | POA: Diagnosis not present

## 2020-11-11 DIAGNOSIS — H401130 Primary open-angle glaucoma, bilateral, stage unspecified: Secondary | ICD-10-CM | POA: Diagnosis not present

## 2020-11-22 DIAGNOSIS — N3281 Overactive bladder: Secondary | ICD-10-CM | POA: Diagnosis not present

## 2021-01-30 DIAGNOSIS — I340 Nonrheumatic mitral (valve) insufficiency: Secondary | ICD-10-CM | POA: Diagnosis not present

## 2021-02-25 DIAGNOSIS — I38 Endocarditis, valve unspecified: Secondary | ICD-10-CM | POA: Diagnosis not present

## 2021-02-25 DIAGNOSIS — I340 Nonrheumatic mitral (valve) insufficiency: Secondary | ICD-10-CM | POA: Diagnosis not present

## 2021-02-25 DIAGNOSIS — E785 Hyperlipidemia, unspecified: Secondary | ICD-10-CM | POA: Diagnosis not present

## 2021-02-25 DIAGNOSIS — R079 Chest pain, unspecified: Secondary | ICD-10-CM | POA: Diagnosis not present

## 2021-02-25 DIAGNOSIS — G4733 Obstructive sleep apnea (adult) (pediatric): Secondary | ICD-10-CM | POA: Diagnosis not present

## 2021-02-25 DIAGNOSIS — I10 Essential (primary) hypertension: Secondary | ICD-10-CM | POA: Diagnosis not present

## 2021-02-25 DIAGNOSIS — E119 Type 2 diabetes mellitus without complications: Secondary | ICD-10-CM | POA: Diagnosis not present

## 2021-03-05 DIAGNOSIS — G4733 Obstructive sleep apnea (adult) (pediatric): Secondary | ICD-10-CM | POA: Diagnosis not present

## 2021-03-05 DIAGNOSIS — D0371 Melanoma in situ of right lower limb, including hip: Secondary | ICD-10-CM | POA: Diagnosis not present

## 2021-03-05 DIAGNOSIS — E1140 Type 2 diabetes mellitus with diabetic neuropathy, unspecified: Secondary | ICD-10-CM | POA: Diagnosis not present

## 2021-03-05 DIAGNOSIS — Z7984 Long term (current) use of oral hypoglycemic drugs: Secondary | ICD-10-CM | POA: Diagnosis not present

## 2021-03-05 DIAGNOSIS — I10 Essential (primary) hypertension: Secondary | ICD-10-CM | POA: Diagnosis not present

## 2021-03-05 DIAGNOSIS — D45 Polycythemia vera: Secondary | ICD-10-CM | POA: Diagnosis not present

## 2021-03-27 ENCOUNTER — Telehealth: Payer: Self-pay | Admitting: Dietician

## 2021-03-27 DIAGNOSIS — T466X5A Adverse effect of antihyperlipidemic and antiarteriosclerotic drugs, initial encounter: Secondary | ICD-10-CM | POA: Diagnosis not present

## 2021-03-27 DIAGNOSIS — Z125 Encounter for screening for malignant neoplasm of prostate: Secondary | ICD-10-CM | POA: Diagnosis not present

## 2021-03-27 DIAGNOSIS — M545 Low back pain, unspecified: Secondary | ICD-10-CM | POA: Diagnosis not present

## 2021-03-27 DIAGNOSIS — G4733 Obstructive sleep apnea (adult) (pediatric): Secondary | ICD-10-CM | POA: Diagnosis not present

## 2021-03-27 DIAGNOSIS — E119 Type 2 diabetes mellitus without complications: Secondary | ICD-10-CM | POA: Diagnosis not present

## 2021-03-27 DIAGNOSIS — I340 Nonrheumatic mitral (valve) insufficiency: Secondary | ICD-10-CM | POA: Diagnosis not present

## 2021-03-27 DIAGNOSIS — N3941 Urge incontinence: Secondary | ICD-10-CM | POA: Diagnosis not present

## 2021-03-27 DIAGNOSIS — E7800 Pure hypercholesterolemia, unspecified: Secondary | ICD-10-CM | POA: Diagnosis not present

## 2021-03-27 DIAGNOSIS — M791 Myalgia, unspecified site: Secondary | ICD-10-CM | POA: Diagnosis not present

## 2021-03-27 DIAGNOSIS — I10 Essential (primary) hypertension: Secondary | ICD-10-CM | POA: Diagnosis not present

## 2021-03-27 NOTE — Telephone Encounter (Signed)
Error

## 2021-05-23 DIAGNOSIS — N401 Enlarged prostate with lower urinary tract symptoms: Secondary | ICD-10-CM | POA: Diagnosis not present

## 2021-05-23 DIAGNOSIS — N3941 Urge incontinence: Secondary | ICD-10-CM | POA: Diagnosis not present

## 2021-05-23 DIAGNOSIS — N3281 Overactive bladder: Secondary | ICD-10-CM | POA: Diagnosis not present

## 2021-06-03 DIAGNOSIS — N3281 Overactive bladder: Secondary | ICD-10-CM | POA: Diagnosis not present

## 2021-06-03 DIAGNOSIS — N3941 Urge incontinence: Secondary | ICD-10-CM | POA: Diagnosis not present

## 2021-06-12 DIAGNOSIS — L853 Xerosis cutis: Secondary | ICD-10-CM | POA: Diagnosis not present

## 2021-06-12 DIAGNOSIS — D225 Melanocytic nevi of trunk: Secondary | ICD-10-CM | POA: Diagnosis not present

## 2021-06-12 DIAGNOSIS — D2271 Melanocytic nevi of right lower limb, including hip: Secondary | ICD-10-CM | POA: Diagnosis not present

## 2021-06-12 DIAGNOSIS — Z85828 Personal history of other malignant neoplasm of skin: Secondary | ICD-10-CM | POA: Diagnosis not present

## 2021-06-12 DIAGNOSIS — L821 Other seborrheic keratosis: Secondary | ICD-10-CM | POA: Diagnosis not present

## 2021-06-12 DIAGNOSIS — D2272 Melanocytic nevi of left lower limb, including hip: Secondary | ICD-10-CM | POA: Diagnosis not present

## 2021-06-12 DIAGNOSIS — Z85820 Personal history of malignant melanoma of skin: Secondary | ICD-10-CM | POA: Diagnosis not present

## 2021-07-14 DIAGNOSIS — N3941 Urge incontinence: Secondary | ICD-10-CM | POA: Diagnosis not present

## 2021-08-07 DIAGNOSIS — R079 Chest pain, unspecified: Secondary | ICD-10-CM | POA: Diagnosis not present

## 2021-08-07 DIAGNOSIS — I10 Essential (primary) hypertension: Secondary | ICD-10-CM | POA: Diagnosis not present

## 2021-08-07 DIAGNOSIS — I340 Nonrheumatic mitral (valve) insufficiency: Secondary | ICD-10-CM | POA: Diagnosis not present

## 2021-08-07 DIAGNOSIS — I38 Endocarditis, valve unspecified: Secondary | ICD-10-CM | POA: Diagnosis not present

## 2021-08-07 DIAGNOSIS — G4733 Obstructive sleep apnea (adult) (pediatric): Secondary | ICD-10-CM | POA: Diagnosis not present

## 2021-08-07 DIAGNOSIS — E119 Type 2 diabetes mellitus without complications: Secondary | ICD-10-CM | POA: Diagnosis not present

## 2021-08-07 DIAGNOSIS — E785 Hyperlipidemia, unspecified: Secondary | ICD-10-CM | POA: Diagnosis not present

## 2021-08-19 DIAGNOSIS — H6123 Impacted cerumen, bilateral: Secondary | ICD-10-CM | POA: Diagnosis not present

## 2021-08-19 DIAGNOSIS — H903 Sensorineural hearing loss, bilateral: Secondary | ICD-10-CM | POA: Diagnosis not present

## 2021-08-27 DIAGNOSIS — M25552 Pain in left hip: Secondary | ICD-10-CM | POA: Diagnosis not present

## 2021-08-27 DIAGNOSIS — M7062 Trochanteric bursitis, left hip: Secondary | ICD-10-CM | POA: Diagnosis not present

## 2021-09-22 DIAGNOSIS — E785 Hyperlipidemia, unspecified: Secondary | ICD-10-CM | POA: Diagnosis not present

## 2021-09-22 DIAGNOSIS — N400 Enlarged prostate without lower urinary tract symptoms: Secondary | ICD-10-CM | POA: Diagnosis not present

## 2021-09-22 DIAGNOSIS — E119 Type 2 diabetes mellitus without complications: Secondary | ICD-10-CM | POA: Diagnosis not present

## 2021-09-26 DIAGNOSIS — Z87442 Personal history of urinary calculi: Secondary | ICD-10-CM | POA: Diagnosis not present

## 2021-09-26 DIAGNOSIS — N401 Enlarged prostate with lower urinary tract symptoms: Secondary | ICD-10-CM | POA: Diagnosis not present

## 2021-09-29 DIAGNOSIS — N400 Enlarged prostate without lower urinary tract symptoms: Secondary | ICD-10-CM | POA: Diagnosis not present

## 2021-09-29 DIAGNOSIS — E1140 Type 2 diabetes mellitus with diabetic neuropathy, unspecified: Secondary | ICD-10-CM | POA: Diagnosis not present

## 2021-09-29 DIAGNOSIS — M545 Low back pain, unspecified: Secondary | ICD-10-CM | POA: Diagnosis not present

## 2021-09-29 DIAGNOSIS — I10 Essential (primary) hypertension: Secondary | ICD-10-CM | POA: Diagnosis not present

## 2021-09-29 DIAGNOSIS — I340 Nonrheumatic mitral (valve) insufficiency: Secondary | ICD-10-CM | POA: Diagnosis not present

## 2021-09-29 DIAGNOSIS — E785 Hyperlipidemia, unspecified: Secondary | ICD-10-CM | POA: Diagnosis not present

## 2021-09-29 DIAGNOSIS — E1165 Type 2 diabetes mellitus with hyperglycemia: Secondary | ICD-10-CM | POA: Diagnosis not present

## 2021-09-29 DIAGNOSIS — Z7984 Long term (current) use of oral hypoglycemic drugs: Secondary | ICD-10-CM | POA: Diagnosis not present

## 2021-09-29 DIAGNOSIS — G4733 Obstructive sleep apnea (adult) (pediatric): Secondary | ICD-10-CM | POA: Diagnosis not present

## 2021-09-30 DIAGNOSIS — M25552 Pain in left hip: Secondary | ICD-10-CM | POA: Diagnosis not present

## 2021-09-30 DIAGNOSIS — M1612 Unilateral primary osteoarthritis, left hip: Secondary | ICD-10-CM | POA: Diagnosis not present

## 2021-09-30 DIAGNOSIS — M7062 Trochanteric bursitis, left hip: Secondary | ICD-10-CM | POA: Diagnosis not present

## 2021-10-02 DIAGNOSIS — M47896 Other spondylosis, lumbar region: Secondary | ICD-10-CM | POA: Diagnosis not present

## 2021-11-10 DIAGNOSIS — E785 Hyperlipidemia, unspecified: Secondary | ICD-10-CM | POA: Diagnosis not present

## 2021-11-10 DIAGNOSIS — Z139 Encounter for screening, unspecified: Secondary | ICD-10-CM | POA: Diagnosis not present

## 2021-11-10 DIAGNOSIS — Z0000 Encounter for general adult medical examination without abnormal findings: Secondary | ICD-10-CM | POA: Diagnosis not present

## 2021-11-10 DIAGNOSIS — E669 Obesity, unspecified: Secondary | ICD-10-CM | POA: Diagnosis not present

## 2021-11-10 DIAGNOSIS — Z1331 Encounter for screening for depression: Secondary | ICD-10-CM | POA: Diagnosis not present

## 2021-11-10 DIAGNOSIS — Z6831 Body mass index (BMI) 31.0-31.9, adult: Secondary | ICD-10-CM | POA: Diagnosis not present

## 2021-11-10 DIAGNOSIS — Z9181 History of falling: Secondary | ICD-10-CM | POA: Diagnosis not present

## 2021-12-17 DIAGNOSIS — L821 Other seborrheic keratosis: Secondary | ICD-10-CM | POA: Diagnosis not present

## 2021-12-17 DIAGNOSIS — Z85828 Personal history of other malignant neoplasm of skin: Secondary | ICD-10-CM | POA: Diagnosis not present

## 2021-12-17 DIAGNOSIS — D225 Melanocytic nevi of trunk: Secondary | ICD-10-CM | POA: Diagnosis not present

## 2021-12-17 DIAGNOSIS — D485 Neoplasm of uncertain behavior of skin: Secondary | ICD-10-CM | POA: Diagnosis not present

## 2021-12-17 DIAGNOSIS — D2261 Melanocytic nevi of right upper limb, including shoulder: Secondary | ICD-10-CM | POA: Diagnosis not present

## 2021-12-17 DIAGNOSIS — D2272 Melanocytic nevi of left lower limb, including hip: Secondary | ICD-10-CM | POA: Diagnosis not present

## 2021-12-17 DIAGNOSIS — D2262 Melanocytic nevi of left upper limb, including shoulder: Secondary | ICD-10-CM | POA: Diagnosis not present

## 2021-12-17 DIAGNOSIS — D692 Other nonthrombocytopenic purpura: Secondary | ICD-10-CM | POA: Diagnosis not present

## 2021-12-17 DIAGNOSIS — Z85820 Personal history of malignant melanoma of skin: Secondary | ICD-10-CM | POA: Diagnosis not present

## 2021-12-18 DIAGNOSIS — E109 Type 1 diabetes mellitus without complications: Secondary | ICD-10-CM | POA: Diagnosis not present

## 2022-01-12 DIAGNOSIS — I340 Nonrheumatic mitral (valve) insufficiency: Secondary | ICD-10-CM | POA: Diagnosis not present

## 2022-01-19 DIAGNOSIS — G4733 Obstructive sleep apnea (adult) (pediatric): Secondary | ICD-10-CM | POA: Diagnosis not present

## 2022-01-19 DIAGNOSIS — I340 Nonrheumatic mitral (valve) insufficiency: Secondary | ICD-10-CM | POA: Diagnosis not present

## 2022-01-19 DIAGNOSIS — E119 Type 2 diabetes mellitus without complications: Secondary | ICD-10-CM | POA: Diagnosis not present

## 2022-01-19 DIAGNOSIS — I38 Endocarditis, valve unspecified: Secondary | ICD-10-CM | POA: Diagnosis not present

## 2022-01-19 DIAGNOSIS — I10 Essential (primary) hypertension: Secondary | ICD-10-CM | POA: Diagnosis not present

## 2022-01-19 DIAGNOSIS — E785 Hyperlipidemia, unspecified: Secondary | ICD-10-CM | POA: Diagnosis not present

## 2022-02-18 DIAGNOSIS — D225 Melanocytic nevi of trunk: Secondary | ICD-10-CM | POA: Diagnosis not present

## 2022-02-18 DIAGNOSIS — D235 Other benign neoplasm of skin of trunk: Secondary | ICD-10-CM | POA: Diagnosis not present

## 2022-03-30 DIAGNOSIS — G4733 Obstructive sleep apnea (adult) (pediatric): Secondary | ICD-10-CM | POA: Diagnosis not present

## 2022-03-31 DIAGNOSIS — Z87442 Personal history of urinary calculi: Secondary | ICD-10-CM | POA: Diagnosis not present

## 2022-03-31 DIAGNOSIS — N401 Enlarged prostate with lower urinary tract symptoms: Secondary | ICD-10-CM | POA: Diagnosis not present

## 2022-04-07 DIAGNOSIS — G4733 Obstructive sleep apnea (adult) (pediatric): Secondary | ICD-10-CM | POA: Diagnosis not present

## 2022-04-07 DIAGNOSIS — E119 Type 2 diabetes mellitus without complications: Secondary | ICD-10-CM | POA: Diagnosis not present

## 2022-04-07 DIAGNOSIS — I340 Nonrheumatic mitral (valve) insufficiency: Secondary | ICD-10-CM | POA: Diagnosis not present

## 2022-04-07 DIAGNOSIS — M545 Low back pain, unspecified: Secondary | ICD-10-CM | POA: Diagnosis not present

## 2022-04-07 DIAGNOSIS — I10 Essential (primary) hypertension: Secondary | ICD-10-CM | POA: Diagnosis not present

## 2022-04-07 DIAGNOSIS — E7800 Pure hypercholesterolemia, unspecified: Secondary | ICD-10-CM | POA: Diagnosis not present

## 2022-04-07 DIAGNOSIS — M791 Myalgia, unspecified site: Secondary | ICD-10-CM | POA: Diagnosis not present

## 2022-04-07 DIAGNOSIS — Z125 Encounter for screening for malignant neoplasm of prostate: Secondary | ICD-10-CM | POA: Diagnosis not present

## 2022-04-07 DIAGNOSIS — T466X5A Adverse effect of antihyperlipidemic and antiarteriosclerotic drugs, initial encounter: Secondary | ICD-10-CM | POA: Diagnosis not present

## 2022-05-12 DIAGNOSIS — J309 Allergic rhinitis, unspecified: Secondary | ICD-10-CM | POA: Diagnosis not present

## 2022-05-12 DIAGNOSIS — R059 Cough, unspecified: Secondary | ICD-10-CM | POA: Diagnosis not present

## 2022-05-12 DIAGNOSIS — I10 Essential (primary) hypertension: Secondary | ICD-10-CM | POA: Diagnosis not present

## 2022-05-12 DIAGNOSIS — E119 Type 2 diabetes mellitus without complications: Secondary | ICD-10-CM | POA: Diagnosis not present

## 2022-06-11 DIAGNOSIS — Z7952 Long term (current) use of systemic steroids: Secondary | ICD-10-CM | POA: Diagnosis not present

## 2022-06-11 DIAGNOSIS — R062 Wheezing: Secondary | ICD-10-CM | POA: Diagnosis not present

## 2022-06-11 DIAGNOSIS — J209 Acute bronchitis, unspecified: Secondary | ICD-10-CM | POA: Diagnosis not present

## 2022-06-11 DIAGNOSIS — R059 Cough, unspecified: Secondary | ICD-10-CM | POA: Diagnosis not present

## 2022-06-11 DIAGNOSIS — R0602 Shortness of breath: Secondary | ICD-10-CM | POA: Diagnosis not present

## 2022-06-22 DIAGNOSIS — E785 Hyperlipidemia, unspecified: Secondary | ICD-10-CM | POA: Diagnosis not present

## 2022-06-22 DIAGNOSIS — E119 Type 2 diabetes mellitus without complications: Secondary | ICD-10-CM | POA: Diagnosis not present

## 2022-06-22 DIAGNOSIS — I38 Endocarditis, valve unspecified: Secondary | ICD-10-CM | POA: Diagnosis not present

## 2022-06-22 DIAGNOSIS — Z7984 Long term (current) use of oral hypoglycemic drugs: Secondary | ICD-10-CM | POA: Diagnosis not present

## 2022-06-22 DIAGNOSIS — I340 Nonrheumatic mitral (valve) insufficiency: Secondary | ICD-10-CM | POA: Diagnosis not present

## 2022-06-22 DIAGNOSIS — G4733 Obstructive sleep apnea (adult) (pediatric): Secondary | ICD-10-CM | POA: Diagnosis not present

## 2022-06-22 DIAGNOSIS — I10 Essential (primary) hypertension: Secondary | ICD-10-CM | POA: Diagnosis not present

## 2022-06-22 DIAGNOSIS — I779 Disorder of arteries and arterioles, unspecified: Secondary | ICD-10-CM | POA: Diagnosis not present

## 2022-07-22 DIAGNOSIS — I779 Disorder of arteries and arterioles, unspecified: Secondary | ICD-10-CM | POA: Diagnosis not present

## 2022-07-30 DIAGNOSIS — I10 Essential (primary) hypertension: Secondary | ICD-10-CM | POA: Diagnosis not present

## 2022-07-30 DIAGNOSIS — E119 Type 2 diabetes mellitus without complications: Secondary | ICD-10-CM | POA: Diagnosis not present

## 2022-07-30 DIAGNOSIS — I340 Nonrheumatic mitral (valve) insufficiency: Secondary | ICD-10-CM | POA: Diagnosis not present

## 2022-07-30 DIAGNOSIS — G4733 Obstructive sleep apnea (adult) (pediatric): Secondary | ICD-10-CM | POA: Diagnosis not present

## 2022-07-30 DIAGNOSIS — Z7984 Long term (current) use of oral hypoglycemic drugs: Secondary | ICD-10-CM | POA: Diagnosis not present

## 2022-07-30 DIAGNOSIS — E785 Hyperlipidemia, unspecified: Secondary | ICD-10-CM | POA: Diagnosis not present

## 2022-08-20 DIAGNOSIS — E785 Hyperlipidemia, unspecified: Secondary | ICD-10-CM | POA: Diagnosis not present

## 2022-08-20 DIAGNOSIS — G4733 Obstructive sleep apnea (adult) (pediatric): Secondary | ICD-10-CM | POA: Diagnosis not present

## 2022-08-20 DIAGNOSIS — I38 Endocarditis, valve unspecified: Secondary | ICD-10-CM | POA: Diagnosis not present

## 2022-08-20 DIAGNOSIS — I10 Essential (primary) hypertension: Secondary | ICD-10-CM | POA: Diagnosis not present

## 2022-08-20 DIAGNOSIS — I340 Nonrheumatic mitral (valve) insufficiency: Secondary | ICD-10-CM | POA: Diagnosis not present

## 2022-09-02 DIAGNOSIS — H6123 Impacted cerumen, bilateral: Secondary | ICD-10-CM | POA: Diagnosis not present

## 2022-09-02 DIAGNOSIS — R0982 Postnasal drip: Secondary | ICD-10-CM | POA: Diagnosis not present

## 2022-09-02 DIAGNOSIS — H906 Mixed conductive and sensorineural hearing loss, bilateral: Secondary | ICD-10-CM | POA: Diagnosis not present

## 2022-09-29 DIAGNOSIS — N400 Enlarged prostate without lower urinary tract symptoms: Secondary | ICD-10-CM | POA: Diagnosis not present

## 2022-09-29 DIAGNOSIS — E1165 Type 2 diabetes mellitus with hyperglycemia: Secondary | ICD-10-CM | POA: Diagnosis not present

## 2022-09-29 DIAGNOSIS — E785 Hyperlipidemia, unspecified: Secondary | ICD-10-CM | POA: Diagnosis not present

## 2022-10-01 DIAGNOSIS — N401 Enlarged prostate with lower urinary tract symptoms: Secondary | ICD-10-CM | POA: Diagnosis not present

## 2022-10-01 DIAGNOSIS — Z87442 Personal history of urinary calculi: Secondary | ICD-10-CM | POA: Diagnosis not present

## 2022-11-19 DIAGNOSIS — I10 Essential (primary) hypertension: Secondary | ICD-10-CM | POA: Diagnosis not present

## 2022-11-19 DIAGNOSIS — E1140 Type 2 diabetes mellitus with diabetic neuropathy, unspecified: Secondary | ICD-10-CM | POA: Diagnosis not present

## 2022-11-19 DIAGNOSIS — M1990 Unspecified osteoarthritis, unspecified site: Secondary | ICD-10-CM | POA: Diagnosis not present

## 2022-11-19 DIAGNOSIS — G4733 Obstructive sleep apnea (adult) (pediatric): Secondary | ICD-10-CM | POA: Diagnosis not present

## 2022-11-19 DIAGNOSIS — Z23 Encounter for immunization: Secondary | ICD-10-CM | POA: Diagnosis not present

## 2022-11-19 DIAGNOSIS — E785 Hyperlipidemia, unspecified: Secondary | ICD-10-CM | POA: Diagnosis not present

## 2022-11-19 DIAGNOSIS — R972 Elevated prostate specific antigen [PSA]: Secondary | ICD-10-CM | POA: Diagnosis not present

## 2022-11-19 DIAGNOSIS — I340 Nonrheumatic mitral (valve) insufficiency: Secondary | ICD-10-CM | POA: Diagnosis not present

## 2022-11-25 DIAGNOSIS — G4733 Obstructive sleep apnea (adult) (pediatric): Secondary | ICD-10-CM | POA: Diagnosis not present

## 2022-12-23 DIAGNOSIS — Z872 Personal history of diseases of the skin and subcutaneous tissue: Secondary | ICD-10-CM | POA: Diagnosis not present

## 2022-12-23 DIAGNOSIS — D2262 Melanocytic nevi of left upper limb, including shoulder: Secondary | ICD-10-CM | POA: Diagnosis not present

## 2022-12-23 DIAGNOSIS — D225 Melanocytic nevi of trunk: Secondary | ICD-10-CM | POA: Diagnosis not present

## 2022-12-23 DIAGNOSIS — Z85820 Personal history of malignant melanoma of skin: Secondary | ICD-10-CM | POA: Diagnosis not present

## 2022-12-23 DIAGNOSIS — L821 Other seborrheic keratosis: Secondary | ICD-10-CM | POA: Diagnosis not present

## 2022-12-23 DIAGNOSIS — D2261 Melanocytic nevi of right upper limb, including shoulder: Secondary | ICD-10-CM | POA: Diagnosis not present

## 2022-12-23 DIAGNOSIS — D2272 Melanocytic nevi of left lower limb, including hip: Secondary | ICD-10-CM | POA: Diagnosis not present

## 2022-12-23 DIAGNOSIS — Z85828 Personal history of other malignant neoplasm of skin: Secondary | ICD-10-CM | POA: Diagnosis not present

## 2022-12-23 DIAGNOSIS — D2271 Melanocytic nevi of right lower limb, including hip: Secondary | ICD-10-CM | POA: Diagnosis not present

## 2022-12-28 DIAGNOSIS — I10 Essential (primary) hypertension: Secondary | ICD-10-CM | POA: Diagnosis not present

## 2022-12-28 DIAGNOSIS — I340 Nonrheumatic mitral (valve) insufficiency: Secondary | ICD-10-CM | POA: Diagnosis not present

## 2022-12-28 DIAGNOSIS — I38 Endocarditis, valve unspecified: Secondary | ICD-10-CM | POA: Diagnosis not present

## 2022-12-28 DIAGNOSIS — G4733 Obstructive sleep apnea (adult) (pediatric): Secondary | ICD-10-CM | POA: Diagnosis not present

## 2022-12-28 DIAGNOSIS — E785 Hyperlipidemia, unspecified: Secondary | ICD-10-CM | POA: Diagnosis not present

## 2023-01-04 DIAGNOSIS — L603 Nail dystrophy: Secondary | ICD-10-CM | POA: Diagnosis not present

## 2023-01-04 DIAGNOSIS — E119 Type 2 diabetes mellitus without complications: Secondary | ICD-10-CM | POA: Diagnosis not present

## 2023-01-19 DIAGNOSIS — I340 Nonrheumatic mitral (valve) insufficiency: Secondary | ICD-10-CM | POA: Diagnosis not present

## 2023-02-05 DIAGNOSIS — J019 Acute sinusitis, unspecified: Secondary | ICD-10-CM | POA: Diagnosis not present

## 2023-02-05 DIAGNOSIS — Z6828 Body mass index (BMI) 28.0-28.9, adult: Secondary | ICD-10-CM | POA: Diagnosis not present

## 2023-02-05 DIAGNOSIS — I10 Essential (primary) hypertension: Secondary | ICD-10-CM | POA: Diagnosis not present

## 2023-03-17 DIAGNOSIS — Z139 Encounter for screening, unspecified: Secondary | ICD-10-CM | POA: Diagnosis not present

## 2023-03-17 DIAGNOSIS — Z1331 Encounter for screening for depression: Secondary | ICD-10-CM | POA: Diagnosis not present

## 2023-03-17 DIAGNOSIS — Z9181 History of falling: Secondary | ICD-10-CM | POA: Diagnosis not present

## 2023-03-17 DIAGNOSIS — I10 Essential (primary) hypertension: Secondary | ICD-10-CM | POA: Diagnosis not present

## 2023-03-17 DIAGNOSIS — E7800 Pure hypercholesterolemia, unspecified: Secondary | ICD-10-CM | POA: Diagnosis not present

## 2023-03-17 DIAGNOSIS — M791 Myalgia, unspecified site: Secondary | ICD-10-CM | POA: Diagnosis not present

## 2023-03-17 DIAGNOSIS — T466X5A Adverse effect of antihyperlipidemic and antiarteriosclerotic drugs, initial encounter: Secondary | ICD-10-CM | POA: Diagnosis not present

## 2023-03-17 DIAGNOSIS — I340 Nonrheumatic mitral (valve) insufficiency: Secondary | ICD-10-CM | POA: Diagnosis not present

## 2023-03-17 DIAGNOSIS — E119 Type 2 diabetes mellitus without complications: Secondary | ICD-10-CM | POA: Diagnosis not present

## 2023-03-29 DIAGNOSIS — H04563 Stenosis of bilateral lacrimal punctum: Secondary | ICD-10-CM | POA: Diagnosis not present

## 2023-03-29 DIAGNOSIS — H0289 Other specified disorders of eyelid: Secondary | ICD-10-CM | POA: Diagnosis not present

## 2023-03-29 DIAGNOSIS — H04223 Epiphora due to insufficient drainage, bilateral lacrimal glands: Secondary | ICD-10-CM | POA: Diagnosis not present

## 2023-03-29 DIAGNOSIS — H02831 Dermatochalasis of right upper eyelid: Secondary | ICD-10-CM | POA: Diagnosis not present

## 2023-04-14 DIAGNOSIS — G4733 Obstructive sleep apnea (adult) (pediatric): Secondary | ICD-10-CM | POA: Diagnosis not present

## 2023-04-26 DIAGNOSIS — M7712 Lateral epicondylitis, left elbow: Secondary | ICD-10-CM | POA: Diagnosis not present

## 2023-04-26 DIAGNOSIS — M25522 Pain in left elbow: Secondary | ICD-10-CM | POA: Diagnosis not present

## 2023-06-08 DIAGNOSIS — M7712 Lateral epicondylitis, left elbow: Secondary | ICD-10-CM | POA: Diagnosis not present

## 2023-06-14 DIAGNOSIS — M7062 Trochanteric bursitis, left hip: Secondary | ICD-10-CM | POA: Diagnosis not present

## 2023-06-14 DIAGNOSIS — M47816 Spondylosis without myelopathy or radiculopathy, lumbar region: Secondary | ICD-10-CM | POA: Diagnosis not present

## 2023-06-14 DIAGNOSIS — Z981 Arthrodesis status: Secondary | ICD-10-CM | POA: Diagnosis not present

## 2023-06-14 DIAGNOSIS — M545 Low back pain, unspecified: Secondary | ICD-10-CM | POA: Diagnosis not present

## 2023-06-29 DIAGNOSIS — I38 Endocarditis, valve unspecified: Secondary | ICD-10-CM | POA: Diagnosis not present

## 2023-06-29 DIAGNOSIS — E785 Hyperlipidemia, unspecified: Secondary | ICD-10-CM | POA: Diagnosis not present

## 2023-06-29 DIAGNOSIS — I340 Nonrheumatic mitral (valve) insufficiency: Secondary | ICD-10-CM | POA: Diagnosis not present

## 2023-06-29 DIAGNOSIS — I10 Essential (primary) hypertension: Secondary | ICD-10-CM | POA: Diagnosis not present

## 2023-06-29 DIAGNOSIS — R42 Dizziness and giddiness: Secondary | ICD-10-CM | POA: Diagnosis not present

## 2023-06-29 DIAGNOSIS — G4733 Obstructive sleep apnea (adult) (pediatric): Secondary | ICD-10-CM | POA: Diagnosis not present

## 2023-06-30 DIAGNOSIS — R42 Dizziness and giddiness: Secondary | ICD-10-CM | POA: Diagnosis not present

## 2023-07-13 DIAGNOSIS — M47816 Spondylosis without myelopathy or radiculopathy, lumbar region: Secondary | ICD-10-CM | POA: Diagnosis not present

## 2023-07-20 DIAGNOSIS — M47816 Spondylosis without myelopathy or radiculopathy, lumbar region: Secondary | ICD-10-CM | POA: Diagnosis not present

## 2023-09-01 DIAGNOSIS — H900 Conductive hearing loss, bilateral: Secondary | ICD-10-CM | POA: Diagnosis not present

## 2023-09-01 DIAGNOSIS — H6123 Impacted cerumen, bilateral: Secondary | ICD-10-CM | POA: Diagnosis not present

## 2023-09-23 DIAGNOSIS — E785 Hyperlipidemia, unspecified: Secondary | ICD-10-CM | POA: Diagnosis not present

## 2023-09-23 DIAGNOSIS — R972 Elevated prostate specific antigen [PSA]: Secondary | ICD-10-CM | POA: Diagnosis not present

## 2023-09-23 DIAGNOSIS — E119 Type 2 diabetes mellitus without complications: Secondary | ICD-10-CM | POA: Diagnosis not present

## 2023-09-30 DIAGNOSIS — Z6831 Body mass index (BMI) 31.0-31.9, adult: Secondary | ICD-10-CM | POA: Diagnosis not present

## 2023-09-30 DIAGNOSIS — Z0000 Encounter for general adult medical examination without abnormal findings: Secondary | ICD-10-CM | POA: Diagnosis not present

## 2023-09-30 DIAGNOSIS — I10 Essential (primary) hypertension: Secondary | ICD-10-CM | POA: Diagnosis not present

## 2023-09-30 DIAGNOSIS — Z1211 Encounter for screening for malignant neoplasm of colon: Secondary | ICD-10-CM | POA: Diagnosis not present

## 2023-09-30 DIAGNOSIS — Z136 Encounter for screening for cardiovascular disorders: Secondary | ICD-10-CM | POA: Diagnosis not present

## 2023-09-30 DIAGNOSIS — Z1389 Encounter for screening for other disorder: Secondary | ICD-10-CM | POA: Diagnosis not present

## 2023-09-30 DIAGNOSIS — Z135 Encounter for screening for eye and ear disorders: Secondary | ICD-10-CM | POA: Diagnosis not present

## 2023-09-30 DIAGNOSIS — Z125 Encounter for screening for malignant neoplasm of prostate: Secondary | ICD-10-CM | POA: Diagnosis not present

## 2023-09-30 DIAGNOSIS — Z23 Encounter for immunization: Secondary | ICD-10-CM | POA: Diagnosis not present

## 2023-10-06 DIAGNOSIS — N200 Calculus of kidney: Secondary | ICD-10-CM | POA: Diagnosis not present

## 2023-10-06 DIAGNOSIS — N401 Enlarged prostate with lower urinary tract symptoms: Secondary | ICD-10-CM | POA: Diagnosis not present

## 2023-10-26 DIAGNOSIS — N132 Hydronephrosis with renal and ureteral calculous obstruction: Secondary | ICD-10-CM | POA: Diagnosis not present

## 2023-10-26 DIAGNOSIS — N200 Calculus of kidney: Secondary | ICD-10-CM | POA: Diagnosis not present

## 2023-11-02 DIAGNOSIS — N200 Calculus of kidney: Secondary | ICD-10-CM | POA: Diagnosis not present

## 2023-12-02 DIAGNOSIS — N201 Calculus of ureter: Secondary | ICD-10-CM | POA: Diagnosis not present

## 2023-12-02 DIAGNOSIS — N200 Calculus of kidney: Secondary | ICD-10-CM | POA: Diagnosis not present

## 2023-12-21 DIAGNOSIS — N201 Calculus of ureter: Secondary | ICD-10-CM | POA: Diagnosis not present

## 2023-12-21 DIAGNOSIS — N281 Cyst of kidney, acquired: Secondary | ICD-10-CM | POA: Diagnosis not present

## 2023-12-22 DIAGNOSIS — L820 Inflamed seborrheic keratosis: Secondary | ICD-10-CM | POA: Diagnosis not present

## 2023-12-22 DIAGNOSIS — L814 Other melanin hyperpigmentation: Secondary | ICD-10-CM | POA: Diagnosis not present

## 2023-12-22 DIAGNOSIS — D1801 Hemangioma of skin and subcutaneous tissue: Secondary | ICD-10-CM | POA: Diagnosis not present

## 2023-12-22 DIAGNOSIS — L2989 Other pruritus: Secondary | ICD-10-CM | POA: Diagnosis not present

## 2023-12-22 DIAGNOSIS — L821 Other seborrheic keratosis: Secondary | ICD-10-CM | POA: Diagnosis not present

## 2023-12-22 DIAGNOSIS — L570 Actinic keratosis: Secondary | ICD-10-CM | POA: Diagnosis not present

## 2023-12-22 DIAGNOSIS — X32XXXA Exposure to sunlight, initial encounter: Secondary | ICD-10-CM | POA: Diagnosis not present

## 2023-12-22 DIAGNOSIS — L538 Other specified erythematous conditions: Secondary | ICD-10-CM | POA: Diagnosis not present

## 2023-12-22 DIAGNOSIS — R208 Other disturbances of skin sensation: Secondary | ICD-10-CM | POA: Diagnosis not present

## 2023-12-30 DIAGNOSIS — I38 Endocarditis, valve unspecified: Secondary | ICD-10-CM | POA: Diagnosis not present

## 2023-12-30 DIAGNOSIS — I10 Essential (primary) hypertension: Secondary | ICD-10-CM | POA: Diagnosis not present

## 2023-12-30 DIAGNOSIS — G4733 Obstructive sleep apnea (adult) (pediatric): Secondary | ICD-10-CM | POA: Diagnosis not present

## 2023-12-30 DIAGNOSIS — R079 Chest pain, unspecified: Secondary | ICD-10-CM | POA: Diagnosis not present

## 2023-12-30 DIAGNOSIS — I340 Nonrheumatic mitral (valve) insufficiency: Secondary | ICD-10-CM | POA: Diagnosis not present

## 2023-12-30 DIAGNOSIS — E785 Hyperlipidemia, unspecified: Secondary | ICD-10-CM | POA: Diagnosis not present

## 2024-01-03 DIAGNOSIS — N200 Calculus of kidney: Secondary | ICD-10-CM | POA: Diagnosis not present

## 2024-01-03 DIAGNOSIS — N1330 Unspecified hydronephrosis: Secondary | ICD-10-CM | POA: Diagnosis not present

## 2024-01-04 DIAGNOSIS — I340 Nonrheumatic mitral (valve) insufficiency: Secondary | ICD-10-CM | POA: Diagnosis not present

## 2024-01-13 DIAGNOSIS — R079 Chest pain, unspecified: Secondary | ICD-10-CM | POA: Diagnosis not present
